# Patient Record
Sex: Male | Born: 1980 | Race: Black or African American | Hispanic: No | Marital: Single | Smoking: Current some day smoker
Health system: Southern US, Community
[De-identification: ages and names within clinical notes are randomized; demographics above are authoritative.]

## PROBLEM LIST (undated history)

## (undated) DIAGNOSIS — E785 Hyperlipidemia, unspecified: Secondary | ICD-10-CM

## (undated) HISTORY — PX: KNEE SURGERY: SHX244

---

## 2002-03-19 ENCOUNTER — Inpatient Hospital Stay (HOSPITAL_COMMUNITY): Admission: AC | Admit: 2002-03-19 | Discharge: 2002-03-22 | Payer: Self-pay

## 2002-03-19 ENCOUNTER — Encounter: Payer: Self-pay | Admitting: Emergency Medicine

## 2002-04-13 ENCOUNTER — Encounter: Admission: RE | Admit: 2002-04-13 | Discharge: 2002-05-18 | Payer: Self-pay | Admitting: Orthopedic Surgery

## 2005-04-17 ENCOUNTER — Emergency Department (HOSPITAL_COMMUNITY): Admission: EM | Admit: 2005-04-17 | Discharge: 2005-04-17 | Payer: Self-pay | Admitting: Emergency Medicine

## 2008-02-16 ENCOUNTER — Emergency Department (HOSPITAL_COMMUNITY): Admission: EM | Admit: 2008-02-16 | Discharge: 2008-02-16 | Payer: Self-pay | Admitting: Emergency Medicine

## 2008-05-14 ENCOUNTER — Emergency Department (HOSPITAL_COMMUNITY): Admission: EM | Admit: 2008-05-14 | Discharge: 2008-05-14 | Payer: Self-pay | Admitting: Emergency Medicine

## 2008-11-20 ENCOUNTER — Emergency Department (HOSPITAL_COMMUNITY): Admission: EM | Admit: 2008-11-20 | Discharge: 2008-11-20 | Payer: Self-pay | Admitting: Emergency Medicine

## 2010-05-22 ENCOUNTER — Emergency Department (HOSPITAL_COMMUNITY): Admission: EM | Admit: 2010-05-22 | Discharge: 2010-05-22 | Payer: Self-pay | Admitting: Family Medicine

## 2010-10-28 ENCOUNTER — Emergency Department (HOSPITAL_COMMUNITY)
Admission: EM | Admit: 2010-10-28 | Discharge: 2010-10-28 | Disposition: A | Discharge status: Home / Self Care | Payer: Self-pay | Attending: Emergency Medicine | Admitting: Emergency Medicine

## 2010-10-28 DIAGNOSIS — R059 Cough, unspecified: Secondary | ICD-10-CM | POA: Insufficient documentation

## 2010-10-28 DIAGNOSIS — R112 Nausea with vomiting, unspecified: Secondary | ICD-10-CM | POA: Insufficient documentation

## 2010-10-28 DIAGNOSIS — R05 Cough: Secondary | ICD-10-CM | POA: Insufficient documentation
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
## 2011-08-08 ENCOUNTER — Emergency Department (HOSPITAL_COMMUNITY): Payer: Self-pay

## 2011-08-08 ENCOUNTER — Emergency Department (HOSPITAL_COMMUNITY)
Admission: EM | Admit: 2011-08-08 | Discharge: 2011-08-09 | Disposition: A | Discharge status: Home / Self Care | Payer: Self-pay | Attending: Emergency Medicine | Admitting: Emergency Medicine

## 2011-08-08 ENCOUNTER — Encounter: Payer: Self-pay | Admitting: *Deleted

## 2011-08-08 DIAGNOSIS — S1093XA Contusion of unspecified part of neck, initial encounter: Secondary | ICD-10-CM | POA: Insufficient documentation

## 2011-08-08 DIAGNOSIS — R413 Other amnesia: Secondary | ICD-10-CM | POA: Insufficient documentation

## 2011-08-08 DIAGNOSIS — IMO0002 Reserved for concepts with insufficient information to code with codable children: Secondary | ICD-10-CM | POA: Insufficient documentation

## 2011-08-08 DIAGNOSIS — S0083XA Contusion of other part of head, initial encounter: Secondary | ICD-10-CM

## 2011-08-08 DIAGNOSIS — R220 Localized swelling, mass and lump, head: Secondary | ICD-10-CM | POA: Insufficient documentation

## 2011-08-08 DIAGNOSIS — F29 Unspecified psychosis not due to a substance or known physiological condition: Secondary | ICD-10-CM | POA: Insufficient documentation

## 2011-08-08 DIAGNOSIS — R221 Localized swelling, mass and lump, neck: Secondary | ICD-10-CM | POA: Insufficient documentation

## 2011-08-08 DIAGNOSIS — S0003XA Contusion of scalp, initial encounter: Secondary | ICD-10-CM | POA: Insufficient documentation

## 2011-08-08 DIAGNOSIS — S022XXA Fracture of nasal bones, initial encounter for closed fracture: Secondary | ICD-10-CM | POA: Insufficient documentation

## 2011-08-08 MED ORDER — TETANUS-DIPHTH-ACELL PERTUSSIS 5-2.5-18.5 LF-MCG/0.5 IM SUSP
0.5000 mL | Freq: Once | INTRAMUSCULAR | Status: AC
  Administered 2011-08-08: 0.5 mL via INTRAMUSCULAR
  Filled 2011-08-08: qty 0.5
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
## 2011-08-08 NOTE — ED Notes (Signed)
Resident at bedside to assess pt.  Removed from backboard, c-collar remains.
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
## 2011-08-09 MED ORDER — HYDROCODONE-ACETAMINOPHEN 5-500 MG PO TABS: 1.0000 | ORAL_TABLET | Freq: Four times a day (QID) | ORAL | Status: DC | PRN

## 2011-08-09 MED ORDER — IBUPROFEN 600 MG PO TABS: 600.0000 mg | ORAL_TABLET | Freq: Four times a day (QID) | ORAL | Status: DC | PRN

## 2011-08-09 NOTE — ED Notes (Signed)
Rx x 2, pt voiced understanding to f/u with PCP and surgeon if needed.
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
## 2011-08-13 ENCOUNTER — Emergency Department (HOSPITAL_COMMUNITY)
Admission: EM | Admit: 2011-08-13 | Discharge: 2011-08-13 | Discharge status: Against Medical Advice | Payer: Self-pay | Source: Home / Self Care | Attending: Family Medicine | Admitting: Family Medicine

## 2011-08-13 NOTE — ED Notes (Signed)
No answer in lobby @18 :56 & 20:51

## 2011-08-15 ENCOUNTER — Ambulatory Visit (HOSPITAL_COMMUNITY)
Admission: RE | Admit: 2011-08-15 | Discharge: 2011-08-15 | Disposition: A | Discharge status: Home / Self Care | Payer: Self-pay | Source: Ambulatory Visit | Attending: Otolaryngology | Admitting: Otolaryngology

## 2011-08-15 ENCOUNTER — Other Ambulatory Visit (HOSPITAL_COMMUNITY): Payer: Self-pay | Admitting: Otolaryngology

## 2011-08-15 DIAGNOSIS — S0993XA Unspecified injury of face, initial encounter: Secondary | ICD-10-CM | POA: Insufficient documentation

## 2011-08-15 DIAGNOSIS — X58XXXA Exposure to other specified factors, initial encounter: Secondary | ICD-10-CM | POA: Insufficient documentation

## 2011-08-15 DIAGNOSIS — R52 Pain, unspecified: Secondary | ICD-10-CM

## 2011-08-15 DIAGNOSIS — R51 Headache: Secondary | ICD-10-CM | POA: Insufficient documentation

## 2011-08-15 NOTE — ED Provider Notes (Signed)
History     CSN: [id number] Arrival date & time: 08/13/2011  6:52 PM   First MD Initiated Contact with Patient 08/13/11 1619      No chief complaint on file.   (Consider location/radiation/quality/duration/timing/severity/associated sxs/prior treatment) HPI  No past medical history on file.  No past surgical history on file.  No family history on file.  History  Substance Use Topics  . Smoking status: Not on file  . Smokeless tobacco: Not on file  . Alcohol Use: Yes      Review of Systems  Allergies  Review of patient's allergies indicates no known allergies.  Home Medications   Current Outpatient Rx  Name Route Sig Dispense Refill  . HYDROCODONE-ACETAMINOPHEN 5-500 MG PO TABS Oral Take 1 tablet by mouth every 6 (six) hours as needed for pain. 15 tablet 0  . IBUPROFEN 600 MG PO TABS Oral Take 1 tablet (600 mg total) by mouth every 6 (six) hours as needed for pain. 30 tablet 0    There were no vitals taken for this visit.  Physical Exam  ED Course  Procedures (including critical care time)  Labs Reviewed - No data to display    No diagnosis found.    MDM  Pt. Left before triage.        Sharin Grave, MD 08/15/11 1514

## 2011-08-17 ENCOUNTER — Emergency Department (HOSPITAL_COMMUNITY)
Admission: EM | Admit: 2011-08-17 | Discharge: 2011-08-17 | Disposition: A | Discharge status: Home / Self Care | Payer: Self-pay | Attending: Emergency Medicine | Admitting: Emergency Medicine

## 2011-08-17 ENCOUNTER — Encounter (HOSPITAL_COMMUNITY): Payer: Self-pay | Admitting: Emergency Medicine

## 2011-08-17 DIAGNOSIS — S0003XA Contusion of scalp, initial encounter: Secondary | ICD-10-CM | POA: Insufficient documentation

## 2011-08-17 DIAGNOSIS — R6884 Jaw pain: Secondary | ICD-10-CM | POA: Insufficient documentation

## 2011-08-17 DIAGNOSIS — S0083XA Contusion of other part of head, initial encounter: Secondary | ICD-10-CM

## 2011-08-17 DIAGNOSIS — R220 Localized swelling, mass and lump, head: Secondary | ICD-10-CM | POA: Insufficient documentation

## 2011-08-17 MED ORDER — HYDROCODONE-ACETAMINOPHEN 5-325 MG PO TABS: 1.0000 | ORAL_TABLET | Freq: Once | ORAL | Status: DC

## 2011-08-17 MED ORDER — HYDROCODONE-ACETAMINOPHEN 5-325 MG PO TABS
1.0000 | ORAL_TABLET | Freq: Once | ORAL | Status: AC
  Administered 2011-08-17: 1 via ORAL
  Filled 2011-08-17: qty 1
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
## 2011-08-17 NOTE — ED Notes (Signed)
Gave pt ice pack.
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
## 2011-08-17 NOTE — ED Notes (Signed)
Patient left without being seen.

## 2011-08-18 NOTE — ED Provider Notes (Signed)
Medical screening examination/treatment/procedure(s) were performed by non-physician practitioner and as supervising physician I was immediately available for consultation/collaboration.   Lyanne Co, MD 08/18/11 [phone]

## 2011-08-20 ENCOUNTER — Inpatient Hospital Stay (HOSPITAL_COMMUNITY)
Admission: AD | Admit: 2011-08-20 | Discharge: 2011-08-23 | DRG: 138 | Disposition: A | Discharge status: Home / Self Care | Payer: Self-pay | Source: Ambulatory Visit | Attending: Otolaryngology | Admitting: Otolaryngology

## 2011-08-20 ENCOUNTER — Encounter (HOSPITAL_COMMUNITY): Payer: Self-pay | Admitting: General Practice

## 2011-08-20 ENCOUNTER — Inpatient Hospital Stay (HOSPITAL_COMMUNITY): Payer: Self-pay

## 2011-08-20 DIAGNOSIS — Z23 Encounter for immunization: Secondary | ICD-10-CM

## 2011-08-20 DIAGNOSIS — F17200 Nicotine dependence, unspecified, uncomplicated: Secondary | ICD-10-CM | POA: Diagnosis present

## 2011-08-20 DIAGNOSIS — K122 Cellulitis and abscess of mouth: Principal | ICD-10-CM | POA: Diagnosis present

## 2011-08-20 LAB — DIFFERENTIAL
Basophils Absolute: 0 10*3/uL (ref 0.0–0.1)
Basophils Relative: 0 % (ref 0–1)
Neutro Abs: 8.3 10*3/uL — ABNORMAL HIGH (ref 1.7–7.7)
Neutrophils Relative %: 72 % (ref 43–77)

## 2011-08-20 LAB — BASIC METABOLIC PANEL
BUN: 8 mg/dL (ref 6–23)
Creatinine, Ser: 1.17 mg/dL (ref 0.50–1.35)
GFR calc Af Amer: >90 mL/min (ref >90)
GFR calc non Af Amer: 81 mL/min — ABNORMAL LOW (ref >90)
Potassium: 4.2 mEq/L (ref 3.5–5.1)

## 2011-08-20 LAB — CBC
MCHC: 34.5 g/dL (ref 30.0–36.0)
RDW: 13.5 % (ref 11.5–15.5)

## 2011-08-20 MED ORDER — CHLORHEXIDINE GLUCONATE 0.12 % MT SOLN
15.0000 mL | Freq: Two times a day (BID) | OROMUCOSAL | Status: DC
  Administered 2011-08-20 – 2011-08-22 (×4): 15 mL via OROMUCOSAL
  Filled 2011-08-20 (×4): qty 15

## 2011-08-20 MED ORDER — BIOTENE DRY MOUTH MT LIQD
15.0000 mL | Freq: Two times a day (BID) | OROMUCOSAL | Status: DC
  Administered 2011-08-20 – 2011-08-22 (×4): 15 mL via OROMUCOSAL

## 2011-08-20 MED ORDER — IOHEXOL 300 MG/ML  SOLN: 75.0000 mL | Freq: Once | INTRAMUSCULAR | Status: AC | PRN

## 2011-08-20 MED ORDER — CLINDAMYCIN PHOSPHATE 600 MG/50ML IV SOLN
600.0000 mg | Freq: Four times a day (QID) | INTRAVENOUS | Status: DC
  Administered 2011-08-20 – 2011-08-23 (×12): 600 mg via INTRAVENOUS
  Filled 2011-08-20 (×15): qty 50

## 2011-08-20 MED ORDER — DEXTROSE-NACL 5-0.45 % IV SOLN
INTRAVENOUS | Status: DC
  Administered 2011-08-20 – 2011-08-21 (×3): via INTRAVENOUS

## 2011-08-20 MED ORDER — NICOTINE 7 MG/24HR TD PT24
7.0000 mg | MEDICATED_PATCH | Freq: Every day | TRANSDERMAL | Status: DC
  Administered 2011-08-20 – 2011-08-22 (×3): 7 mg via TRANSDERMAL
  Filled 2011-08-20 (×4): qty 1

## 2011-08-20 MED ORDER — ACETAMINOPHEN 325 MG PO TABS
650.0000 mg | ORAL_TABLET | Freq: Four times a day (QID) | ORAL | Status: DC | PRN
  Administered 2011-08-20 – 2011-08-21 (×3): 650 mg via ORAL
  Filled 2011-08-20 (×3): qty 2

## 2011-08-20 MED ORDER — IBUPROFEN 400 MG PO TABS
400.0000 mg | ORAL_TABLET | Freq: Four times a day (QID) | ORAL | Status: DC | PRN
  Administered 2011-08-20 – 2011-08-23 (×8): 400 mg via ORAL
  Filled 2011-08-20 (×7): qty 1

## 2011-08-20 MED ORDER — INFLUENZA VIRUS VACC SPLIT PF IM SUSP
0.5000 mL | INTRAMUSCULAR | Status: AC
  Administered 2011-08-21: 0.5 mL via INTRAMUSCULAR
  Filled 2011-08-20: qty 0.5
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
## 2011-08-20 NOTE — Progress Notes (Signed)
Patient ID: Nicholas Mercer, male   DOB: 1981/01/02, 30 y.o.   MRN: [id number] CT scan reviewed. There is a hypo-lucent area in the masticator space that does not appear to be directly related to a dental source. I will plan to perform incision and drainage tomorrow morning. We'll start him on a diet for tonight, and some analgesia as well as intravenous antibiotics.

## 2011-08-21 ENCOUNTER — Encounter (HOSPITAL_COMMUNITY): Payer: Self-pay | Admitting: Anesthesiology

## 2011-08-21 ENCOUNTER — Encounter (HOSPITAL_COMMUNITY)
Admission: AD | Disposition: A | Discharge status: Home / Self Care | Payer: Self-pay | Source: Ambulatory Visit | Attending: Otolaryngology

## 2011-08-21 ENCOUNTER — Inpatient Hospital Stay (HOSPITAL_COMMUNITY): Payer: Self-pay | Admitting: Anesthesiology

## 2011-08-21 HISTORY — PX: IRRIGATION AND DEBRIDEMENT ABSCESS: SHX5252

## 2011-08-21 SURGERY — IRRIGATION AND DEBRIDEMENT ABSCESS
Anesthesia: General | Site: Mouth | Laterality: Right | Wound class: Dirty or Infected

## 2011-08-21 MED ORDER — ONDANSETRON HCL 4 MG/2ML IJ SOLN
INTRAMUSCULAR | Status: DC | PRN
  Administered 2011-08-21: 4 mg via INTRAVENOUS

## 2011-08-21 MED ORDER — MIDAZOLAM HCL 5 MG/5ML IJ SOLN
INTRAMUSCULAR | Status: DC | PRN
  Administered 2011-08-21: 2 mg via INTRAVENOUS

## 2011-08-21 MED ORDER — SUCCINYLCHOLINE CHLORIDE 20 MG/ML IJ SOLN
INTRAMUSCULAR | Status: DC | PRN
  Administered 2011-08-21: 100 mg via INTRAVENOUS

## 2011-08-21 MED ORDER — HYDROCODONE-ACETAMINOPHEN 5-325 MG PO TABS
1.0000 | ORAL_TABLET | ORAL | Status: DC | PRN
  Administered 2011-08-21: 1 via ORAL

## 2011-08-21 MED ORDER — LACTATED RINGERS IV SOLN
INTRAVENOUS | Status: DC | PRN
  Administered 2011-08-21: 16:00:00 via INTRAVENOUS

## 2011-08-21 MED ORDER — OXYCODONE-ACETAMINOPHEN 5-325 MG PO TABS
1.0000 | ORAL_TABLET | Freq: Four times a day (QID) | ORAL | Status: DC | PRN
  Administered 2011-08-21 – 2011-08-22 (×3): 2 via ORAL
  Filled 2011-08-21 (×3): qty 2

## 2011-08-21 MED ORDER — PROMETHAZINE HCL 25 MG RE SUPP: 25.0000 mg | Freq: Four times a day (QID) | RECTAL | Status: DC | PRN

## 2011-08-21 MED ORDER — SODIUM CHLORIDE 0.9 % IR SOLN
Status: DC | PRN
  Administered 2011-08-21: 1000 mL

## 2011-08-21 MED ORDER — FENTANYL CITRATE 0.05 MG/ML IJ SOLN
INTRAMUSCULAR | Status: DC | PRN
  Administered 2011-08-21 (×3): 50 ug via INTRAVENOUS
  Administered 2011-08-21: 100 ug via INTRAVENOUS

## 2011-08-21 MED ORDER — LACTATED RINGERS IV SOLN
INTRAVENOUS | Status: DC
  Administered 2011-08-21: 15:00:00 via INTRAVENOUS

## 2011-08-21 MED ORDER — MORPHINE SULFATE 2 MG/ML IJ SOLN
1.0000 mg | INTRAMUSCULAR | Status: DC | PRN
  Administered 2011-08-21 – 2011-08-23 (×3): 2 mg via INTRAVENOUS
  Filled 2011-08-21 (×3): qty 1

## 2011-08-21 MED ORDER — PROPOFOL 10 MG/ML IV EMUL
INTRAVENOUS | Status: DC | PRN
  Administered 2011-08-21: 200 mg via INTRAVENOUS

## 2011-08-21 SURGICAL SUPPLY — 46 items
ATTRACTOMAT 16X20 MAGNETIC DRP (DRAPES) IMPLANT
BANDAGE CONFORM 2  STR LF (GAUZE/BANDAGES/DRESSINGS) IMPLANT
BANDAGE GAUZE ELAST BULKY 4 IN (GAUZE/BANDAGES/DRESSINGS) IMPLANT
BLADE SURG 15 STRL LF DISP TIS (BLADE) ×2 IMPLANT
BLADE SURG 15 STRL SS (BLADE) ×1
CANISTER SUCTION 2500CC (MISCELLANEOUS) IMPLANT
CATH ROBINSON RED A/P 16FR (CATHETERS) IMPLANT
CLEANER TIP ELECTROSURG 2X2 (MISCELLANEOUS) ×3 IMPLANT
CLOTH BEACON ORANGE TIMEOUT ST (SAFETY) ×3 IMPLANT
CONT SPEC 4OZ CLIKSEAL STRL BL (MISCELLANEOUS) ×3 IMPLANT
COVER SURGICAL LIGHT HANDLE (MISCELLANEOUS) ×3 IMPLANT
DRAIN PENROSE 1/4X12 LTX STRL (WOUND CARE) ×3 IMPLANT
DRSG EMULSION OIL 3X3 NADH (GAUZE/BANDAGES/DRESSINGS) ×3 IMPLANT
ELECT COATED BLADE 2.86 ST (ELECTRODE) ×3 IMPLANT
ELECT NEEDLE TIP 2.8 STRL (NEEDLE) IMPLANT
ELECT REM PT RETURN 9FT ADLT (ELECTROSURGICAL) ×3
ELECTRODE REM PT RTRN 9FT ADLT (ELECTROSURGICAL) ×2 IMPLANT
GAUZE PACKING IODOFORM 1/2 (PACKING) ×3 IMPLANT
GAUZE PACKING IODOFORM 1/4X5 (PACKING) ×3 IMPLANT
GAUZE SPONGE 4X4 16PLY XRAY LF (GAUZE/BANDAGES/DRESSINGS) ×3 IMPLANT
GLOVE ECLIPSE 7.5 STRL STRAW (GLOVE) ×3 IMPLANT
GOWN PREVENTION PLUS XLARGE (GOWN DISPOSABLE) ×3 IMPLANT
GOWN PREVENTION PLUS XXLARGE (GOWN DISPOSABLE) ×3 IMPLANT
GOWN STRL NON-REIN LRG LVL3 (GOWN DISPOSABLE) IMPLANT
KIT BASIN OR (CUSTOM PROCEDURE TRAY) ×3 IMPLANT
KIT ROOM TURNOVER OR (KITS) ×3 IMPLANT
NEEDLE HYPO 30X.5 LL (NEEDLE) ×3 IMPLANT
NS IRRIG 1000ML POUR BTL (IV SOLUTION) ×3 IMPLANT
PAD ARMBOARD 7.5X6 YLW CONV (MISCELLANEOUS) ×6 IMPLANT
PENCIL FOOT CONTROL (ELECTRODE) ×3 IMPLANT
POUCH STERILIZING 3 X22 (STERILIZATION PRODUCTS) IMPLANT
SPONGE GAUZE 4X4 12PLY (GAUZE/BANDAGES/DRESSINGS) IMPLANT
SUT CHROMIC 4 0 P 3 18 (SUTURE) ×3 IMPLANT
SUT ETHILON 4 0 PS 2 18 (SUTURE) ×3 IMPLANT
SUT ETHILON 5 0 P 3 18 (SUTURE) ×1
SUT NYLON ETHILON 5-0 P-3 1X18 (SUTURE) ×2 IMPLANT
SUT SILK 4 0 REEL (SUTURE) ×3 IMPLANT
SWAB COLLECTION DEVICE MRSA (MISCELLANEOUS) ×3 IMPLANT
SYR BULB IRRIGATION 50ML (SYRINGE) IMPLANT
SYR CONTROL 10ML LL (SYRINGE) ×3 IMPLANT
TOWEL OR 17X24 6PK STRL BLUE (TOWEL DISPOSABLE) ×3 IMPLANT
TOWEL OR 17X26 10 PK STRL BLUE (TOWEL DISPOSABLE) ×3 IMPLANT
TRAY ENT MC OR (CUSTOM PROCEDURE TRAY) ×3 IMPLANT
TUBE ANAEROBIC SPECIMEN COL (MISCELLANEOUS) ×3 IMPLANT
WATER STERILE IRR 1000ML POUR (IV SOLUTION) ×3 IMPLANT
YANKAUER SUCT BULB TIP NO VENT (SUCTIONS) IMPLANT

## 2011-08-21 NOTE — Transfer of Care (Signed)
Immediate Anesthesia Transfer of Care Note  Patient: Nicholas Mercer  Procedure(s) Performed:  IRRIGATION AND DEBRIDEMENT ABSCESS  Patient Location: PACU  Anesthesia Type: General  Level of Consciousness: awake, alert  and oriented  Airway & Oxygen Therapy: Patient Spontanous Breathing  Post-op Assessment: Report given to PACU RN and Post -op Vital signs reviewed and stable  Post vital signs: Reviewed and stable  Complications: No apparent anesthesia complications

## 2011-08-21 NOTE — Anesthesia Preprocedure Evaluation (Addendum)
Anesthesia Evaluation  Patient identified by MRN, date of birth, ID band Patient awake    Reviewed: Allergy & Precautions, H&P , NPO status , Patient's Chart, lab work & pertinent test results  History of Anesthesia Complications Negative for: history of anesthetic complications  Airway Mallampati: III TM Distance: >3 FB Neck ROM: full  Mouth opening: Limited Mouth Opening  Dental  (+) Teeth Intact and Dental Advidsory Given   Pulmonary Current Smoker,          Cardiovascular neg cardio ROS regular     Neuro/Psych Negative Neurological ROS  Negative Psych ROS   GI/Hepatic negative GI ROS, Neg liver ROS,   Endo/Other  Negative Endocrine ROS  Renal/GU negative Renal ROS  Genitourinary negative   Musculoskeletal   Abdominal Normal abdominal exam  (+)   Peds  Hematology negative hematology ROS (+)   Anesthesia Other Findings   Reproductive/Obstetrics negative OB ROS                           Anesthesia Physical Anesthesia Plan  ASA: II  Anesthesia Plan: General   Post-op Pain Management:    Induction: Intravenous and Rapid sequence  Airway Management Planned: Oral ETT  Additional Equipment:   Intra-op Plan:   Post-operative Plan: Extubation in OR  Informed Consent: I have reviewed the patients History and Physical, chart, labs and discussed the procedure including the risks, benefits and alternatives for the proposed anesthesia with the patient or authorized representative who has indicated his/her understanding and acceptance.   Dental Advisory Given  Plan Discussed with: CRNA and Surgeon  Anesthesia Plan Comments:        Anesthesia Quick Evaluation

## 2011-08-21 NOTE — Progress Notes (Signed)
Received patient from OR s/p I&D of facial abscess. Patient is alert and  Oriented, not in any distress,VSS. Will continue to monitor.

## 2011-08-21 NOTE — Preoperative (Signed)
Beta Blockers   Reason not to administer Beta Blockers:Not Applicable 

## 2011-08-21 NOTE — Anesthesia Postprocedure Evaluation (Signed)
  Anesthesia Post-op Note  Patient: Nicholas Mercer  Procedure(s) Performed:  IRRIGATION AND DEBRIDEMENT ABSCESS  Patient Location: PACU  Anesthesia Type: General  Level of Consciousness: alert   Airway and Oxygen Therapy: Patient connected to nasal cannula oxygen  Post-op Pain: mild  Post-op Assessment: Post-op Vital signs reviewed  Post-op Vital Signs: stable  Complications: No apparent anesthesia complications

## 2011-08-21 NOTE — Op Note (Signed)
OPERATIVE REPORT  DATE OF SURGERY: 08/21/2011  PATIENT:  Nicholas Mercer,  30 y.o. male  PRE-OPERATIVE DIAGNOSIS:  CHEEK ABSESS RIGHT SIDE  POST-OPERATIVE DIAGNOSIS:  CHEEK ABSESS RIGHT SIDE  PROCEDURE:  Procedure(s): IRRIGATION AND DEBRIDEMENT ABSCESS  SURGEON:  Susy Frizzle, MD  ASSISTANTS: none   ANESTHESIA:   general  EBL:  30 ml  DRAINS: none   LOCAL MEDICATIONS USED:  NONE  SPECIMEN:  No Specimen  COUNTS:  YES  PROCEDURE DETAILS: The patient was taken to the operating room and placed on the operating table in the supine position. Following induction of general endotracheal anesthesia, the face was draped in a standard fashion. The oral cavity was inspected. The right septal mucosa was a significant amount of swelling especially inferiorly. Palpation of the parotid gland produced clear healthy appearing salivary secretions. Palpation of the abscess revealed bloody secretions without any frank pus as was present yesterday. Electrocautery was used to incise the mucosa overlying the abscess cavity. Blunt dissection using a large hemostat was then used to open into the loculations of the abscess cavity. Culture risks were used to take samples which were sent for culture and sensitivity testing. Finger dissection was used to open up all loculations completely. The wound was then carefully irrigated with saline solution. The wound was then packed with half inch iodoform gauze. This was secured in place using a single 2-0 silk suture. The pharynx was suctioned of blood and secretions. Patient was awakened extubated and transferred to recovery in stable condition.   PATIENT DISPOSITION:  PACU - hemodynamically stable.

## 2011-08-21 NOTE — Progress Notes (Signed)
08/21/2011 6:52 PM  Georga Kaufmann [id number]  Post-Op Day 0    Temp:  [97.7 F (36.5 C)-99.4 F (37.4 C)] 98.7 F (37.1 C) (12/12 1806) Pulse Rate:  [69-103] 69  (12/12 1806) Resp:  [18-20] 20  (12/12 1806) BP: (108-142)/(68-93) 134/93 mmHg (12/12 1806) SpO2:  [98 %-100 %] 100 % (12/12 1806),     Intake/Output Summary (Last 24 hours) at 08/21/11 1852 Last data filed at 08/21/11 1702  Gross per 24 hour  Intake   1369 ml  Output   1765 ml  Net   -396 ml   Small po   No results found for this or any previous visit (from the past 24 hour(s)).  SUBJECTIVE:  Breathing OK.. Pain not well controlled.  No bleeding.  No void yet.  OBJECTIVE:  Mod facial swelling.  Nu gauze packing intact.    IMPRESSION:  Satisfactory check  PLAN:  Po Percocet.  IV morphine for breakthrough.  Stand to void.  Advance diet, activity.  Cephus Richer

## 2011-08-22 ENCOUNTER — Encounter (HOSPITAL_COMMUNITY): Payer: Self-pay | Admitting: Otolaryngology

## 2011-08-22 MED ORDER — HYDROCODONE-ACETAMINOPHEN 5-325 MG PO TABS
1.0000 | ORAL_TABLET | Freq: Four times a day (QID) | ORAL | Status: DC | PRN
  Administered 2011-08-22 – 2011-08-23 (×4): 2 via ORAL
  Filled 2011-08-22 (×5): qty 2
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
## 2011-08-23 MED ORDER — PROMETHAZINE HCL 25 MG RE SUPP: 25.0000 mg | Freq: Four times a day (QID) | RECTAL | Status: AC | PRN

## 2011-08-23 MED ORDER — CLINDAMYCIN HCL 300 MG PO CAPS: 300.0000 mg | ORAL_CAPSULE | Freq: Four times a day (QID) | ORAL | Status: AC

## 2011-08-23 MED ORDER — CHLORHEXIDINE GLUCONATE 0.12 % MT SOLN: 15.0000 mL | Freq: Two times a day (BID) | OROMUCOSAL | Status: AC

## 2011-08-23 MED ORDER — HYDROCODONE-ACETAMINOPHEN 5-325 MG PO TABS: 1.0000 | ORAL_TABLET | Freq: Four times a day (QID) | ORAL | Status: AC | PRN

## 2011-08-23 NOTE — Progress Notes (Signed)
Patient ID: Nicholas Mercer, male   DOB: Mar 03, 1981, 30 y.o.   MRN: [id number] Subjective: Doing well.  Objective: Vital signs in last 24 hours: Temp:  [97.9 F (36.6 C)-98.3 F (36.8 C)] 97.9 F (36.6 C) (12/14 0600) Pulse Rate:  [81-88] 86  (12/14 0600) Resp:  [18] 18  (12/14 0600) BP: (108-128)/(66-73) 108/66 mmHg (12/14 0600) SpO2:  [98 %-99 %] 98 % (12/14 0600) Weight change:  Last BM Date: 08/16/11  Intake/Output from previous day: 12/13 0701 - 12/14 0700 In: 1312 [P.O.:840; I.V.:472] Out: 1050 [Urine:1050] Intake/Output this shift: Total I/O In: 240 [P.O.:240] Out: -   PHYSICAL EXAM: Swelling persists. Packing removed. Wound granulating.  Lab Results:  [facility] 08/20/11 1156  WBC 11.5*  HGB 14.5  HCT 42.0  PLT 338   BMET  Basename 08/20/11 1156  NA 135  K 4.2  CL 97  CO2 29  GLUCOSE 91  BUN 8  CREATININE 1.17  CALCIUM 9.8    Studies/Results: No results found.  Medications: I have reviewed the patient's current medications.  Assessment/Plan: Progressing well, cultures negative. Discharge home, on Clindamycin.  LOS: 3 days   Lashaun Poch H 08/23/2011, 11:35 AM

## 2011-08-23 NOTE — Progress Notes (Signed)
Pt given dc instructions. Rn went over medications and incision/wound care. Pt verbalized understanding and had no questions regarding care of instructions.  

## 2011-08-23 NOTE — Discharge Summary (Signed)
  Physician Discharge Summary  Patient ID: Nicholas Mercer MRN: [id number] DOB/AGE: [birth year] 30 y.o.  Admit date: 08/20/2011 Discharge date: 08/23/2011  Admission Diagnoses: Masticator space abscess  Discharge Diagnoses:  Active Problems:  * No active hospital problems. *    Discharged Condition: good  Hospital Course: I&D performed, cultures negative. Afebrile. Doing better.  Consults: none  Significant Diagnostic Studies: none  Treatments: surgery: I&D  Discharge Exam: Blood pressure 108/66, pulse 86, temperature 97.9 F (36.6 C), temperature source Oral, resp. rate 18, height 5\' 7"  (1.702 m), weight 86.183 kg (190 lb), SpO2 98.00%. PHYSICAL EXAM: Swelling down, packing removed.   Disposition: Home or Self Care   Current Discharge Medication List    CONTINUE these medications which have NOT CHANGED   Details  diphenhydramine-acetaminophen (TYLENOL PM) 25-500 MG TABS Take 1 tablet by mouth at bedtime as needed. For pain     HYDROcodone-acetaminophen (NORCO) 5-325 MG per tablet Take 1 tablet by mouth every 6 (six) hours as needed. For pain     ibuprofen (ADVIL,MOTRIN) 200 MG tablet Take 600 mg by mouth every 6 (six) hours as needed. For pain       STOP taking these medications     HYDROcodone-acetaminophen (VICODIN) 5-500 MG per tablet          Signed: Alyssa Mancera H 08/23/2011, 11:36 AM

## 2011-08-24 LAB — CULTURE, ROUTINE-ABSCESS

## 2011-08-26 LAB — ANAEROBIC CULTURE

## 2012-09-21 ENCOUNTER — Encounter (HOSPITAL_COMMUNITY): Payer: Self-pay | Admitting: Emergency Medicine

## 2012-09-21 ENCOUNTER — Emergency Department (HOSPITAL_COMMUNITY)
Admission: EM | Admit: 2012-09-21 | Discharge: 2012-09-21 | Disposition: A | Discharge status: Home / Self Care | Payer: Self-pay | Attending: Emergency Medicine | Admitting: Emergency Medicine

## 2012-09-21 DIAGNOSIS — A64 Unspecified sexually transmitted disease: Secondary | ICD-10-CM | POA: Insufficient documentation

## 2012-09-21 DIAGNOSIS — Z202 Contact with and (suspected) exposure to infections with a predominantly sexual mode of transmission: Secondary | ICD-10-CM

## 2012-09-21 DIAGNOSIS — F17200 Nicotine dependence, unspecified, uncomplicated: Secondary | ICD-10-CM | POA: Insufficient documentation

## 2012-09-21 MED ORDER — CEFTRIAXONE SODIUM 250 MG IJ SOLR
250.0000 mg | Freq: Once | INTRAMUSCULAR | Status: AC
  Administered 2012-09-21: 250 mg via INTRAMUSCULAR
  Filled 2012-09-21: qty 250

## 2012-09-21 MED ORDER — AZITHROMYCIN 250 MG PO TABS
1000.0000 mg | ORAL_TABLET | Freq: Once | ORAL | Status: AC
  Administered 2012-09-21: 1000 mg via ORAL
  Filled 2012-09-21: qty 4
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
## 2012-09-21 NOTE — ED Notes (Signed)
Pt states he was informed by a friend that he had been exposed to GC/Chlamydia. Here for testing and tx.

## 2012-09-22 NOTE — ED Provider Notes (Signed)
Medical screening examination/treatment/procedure(s) were performed by non-physician practitioner and as supervising physician I was immediately available for consultation/collaboration.   Charles B. Bernette Mayers, MD 09/22/12 [phone]

## 2016-02-08 ENCOUNTER — Encounter (HOSPITAL_BASED_OUTPATIENT_CLINIC_OR_DEPARTMENT_OTHER): Payer: Self-pay | Admitting: Emergency Medicine

## 2016-02-08 ENCOUNTER — Emergency Department (HOSPITAL_BASED_OUTPATIENT_CLINIC_OR_DEPARTMENT_OTHER)
Admission: EM | Admit: 2016-02-08 | Discharge: 2016-02-08 | Disposition: A | Discharge status: Home / Self Care | Payer: Self-pay | Attending: Emergency Medicine | Admitting: Emergency Medicine

## 2016-02-08 DIAGNOSIS — F17200 Nicotine dependence, unspecified, uncomplicated: Secondary | ICD-10-CM | POA: Insufficient documentation

## 2016-02-08 DIAGNOSIS — L03011 Cellulitis of right finger: Secondary | ICD-10-CM | POA: Insufficient documentation

## 2016-02-08 MED ORDER — CEPHALEXIN 500 MG PO CAPS: 500.0000 mg | ORAL_CAPSULE | Freq: Four times a day (QID) | ORAL | Status: DC

## 2016-02-08 MED ORDER — LIDOCAINE HCL 2 % IJ SOLN
INTRAMUSCULAR | Status: AC
  Filled 2016-02-08: qty 20

## 2016-02-08 MED ORDER — LIDOCAINE HCL (PF) 2 % IJ SOLN
10.0000 mL | Freq: Once | INTRAMUSCULAR | Status: AC
  Administered 2016-02-08: 10 mL via INTRADERMAL

## 2016-02-08 NOTE — ED Provider Notes (Signed)
CSN: [id number]     Arrival date & time 02/08/16  2102 History  By signing my name below, I, [name], attest that this documentation has been prepared under the direction and in the presence of Geoffery Lyonsouglas Aizlynn Digilio, MD . Electronically Signed: Majel HomerPeyton Lee, Scribe. 02/08/2016. 10:45 PM.   Chief Complaint  Patient presents with  . Finger Injury   The history is provided by the patient. No language interpreter was used.    HPI Comments:  Nicholas Mercer is a 35 y.o. male who presents to the Emergency Department complaining of sudden onset, gradually worsening, right middle finger swelling and pain that began 2 days ago. Pt reports that he was bowling the day before he experienced his symptoms. Pt states that his finger began to drain earlier today. He has not taken any medication to alleviate his pain. Pt denies hx of DM and any other complaints.   History reviewed. No pertinent past medical history. Past Surgical History  Procedure Laterality Date  . Knee surgery    . Irrigation and debridement abscess  08/21/2011    Procedure: IRRIGATION AND DEBRIDEMENT ABSCESS;  Surgeon: Susy FrizzleJefry H Rosen, MD;  [facility];  Service: ENT;  Laterality: Right;   History reviewed. No pertinent family history. Social History  Substance Use Topics  . Smoking status: Current Some Day Smoker  . Smokeless tobacco: Never Used  . Alcohol Use: Yes    Review of Systems 10 systems reviewed and all are negative for acute change except as noted in the HPI.  Allergies  Review of patient's allergies indicates no known allergies.  Home Medications   Prior to Admission medications   Not on File   Triage Vitals: BP 121/85 mmHg  Pulse 96  Temp(Src) 98.4 F (36.9 C) (Oral)  Resp 18  Ht 5\' 8"  (1.727 m)  Wt 220 lb (99.791 kg)  BMI 33.46 kg/m2  SpO2 98% Physical Exam  Constitutional: He is oriented to person, place, and time. He appears well-developed and well-nourished.  HENT:  Head: Normocephalic.  Eyes: EOM  are normal.  Neck: Normal range of motion.  Pulmonary/Chest: Effort normal.  Abdominal: He exhibits no distension.  Musculoskeletal: Normal range of motion.  The right middle finger has swelling, tenderness, and purulent drainage from the tissues around the cuticle.  Neurological: He is alert and oriented to person, place, and time.  Psychiatric: He has a normal mood and affect.  Nursing note and vitals reviewed.   ED Course  Procedures  DIAGNOSTIC STUDIES:  Oxygen Saturation is 98% on RA, normal by my interpretation.    COORDINATION OF CARE:  10:31 PM Discussed treatment plan, which includes I&D with pt at bedside and pt agreed to plan.  Labs Review Labs Reviewed - No data to display  Imaging Review No results found. I have personally reviewed and evaluated these images and lab results as part of my medical decision-making.     MDM   Final diagnoses:  None    Digital block performed with 2% lidocaine and incision of the paronychia was made with a #11 blade. A significant amount of pus was expressed. The wound will be dressed. He will be treated with antibiotics, warm soaks, when necessary return.  I personally performed the services described in this documentation, which was scribed in my presence. The recorded information has been reviewed and is accurate.       Geoffery Lyonsouglas Sten Dematteo, MD 02/08/16 2300

## 2016-02-08 NOTE — Discharge Instructions (Signed)
Keflex as prescribed.  Warm soaks to the finger frequently as possible for the next several days.  Return to the emergency department for increased pain, swelling, redness, or other new and concerning symptoms.   Paronychia Paronychia is an infection of the skin that surrounds a nail. It usually affects the skin around a fingernail, but it may also occur near a toenail. It often causes pain and swelling around the nail. This condition may come on suddenly or develop over a longer period. In some cases, a collection of pus (abscess) can form near or under the nail. Usually, paronychia is not serious and it clears up with treatment. CAUSES This condition may be caused by bacteria or fungi. It is commonly caused by either Streptococcus or Staphylococcus bacteria. The bacteria or fungi often cause the infection by getting into the affected area through an opening in the skin, such as a cut or a hangnail. RISK FACTORS This condition is more likely to develop in:  People who get their hands wet often, such as those who work as Fish farm managerdishwashers, bartenders, or nurses.  People who bite their fingernails or suck their thumbs.  People who trim their nails too short.  People who have hangnails or injured fingertips.  People who get manicures.  People who have diabetes. SYMPTOMS Symptoms of this condition include:  Redness and swelling of the skin near the nail.  Tenderness around the nail when you touch the area.  Pus-filled bumps under the cuticle. The cuticle is the skin at the base or sides of the nail.  Fluid or pus under the nail.  Throbbing pain in the area. DIAGNOSIS This condition is usually diagnosed with a physical exam. In some cases, a sample of pus may be taken from an abscess to be tested in a lab. This can help to determine what type of bacteria or fungi is causing the condition. TREATMENT Treatment for this condition depends on the cause and severity of the condition. If the  condition is mild, it may clear up on its own in a few days. Your health care provider may recommend soaking the affected area in warm water a few times a day. When treatment is needed, the options may include:  Antibiotic medicine, if the condition is caused by a bacterial infection.  Antifungal medicine, if the condition is caused by a fungal infection.  Incision and drainage, if an abscess is present. In this procedure, the health care provider will cut open the abscess so the pus can drain out. HOME CARE INSTRUCTIONS  Soak the affected area in warm water if directed to do so by your health care provider. You may be told to do this for 20 minutes, 2-3 times a day. Keep the area dry in between soakings.  Take medicines only as directed by your health care provider.  If you were prescribed an antibiotic medicine, finish all of it even if you start to feel better.  Keep the affected area clean.  Do not try to drain a fluid-filled bump yourself.  If you will be washing dishes or performing other tasks that require your hands to get wet, wear rubber gloves. You should also wear gloves if your hands might come in contact with irritating substances, such as cleaners or chemicals.  Follow your health care provider's instructions about:  Wound care.  Bandage (dressing) changes and removal. SEEK MEDICAL CARE IF:  Your symptoms get worse or do not improve with treatment.  You have a fever or chills.  You have redness spreading from the affected area.  You have continued or increased fluid, blood, or pus coming from the affected area.  Your finger or knuckle becomes swollen or is difficult to move.   This information is not intended to replace advice given to you by your health care provider. Make sure you discuss any questions you have with your health care provider.   Document Released: 02/19/2001 Document Revised: 01/10/2015 Document Reviewed: 08/03/2014 Elsevier Interactive  Patient Education [employer]

## 2016-02-08 NOTE — ED Notes (Addendum)
Patient reports that he is having pain and swelling to his right middle finger. The patient reports that he has had this x 2 days, the patient has noted swelling to his nail bed with a white spot, the patient also has swelling all the way to his knuckle

## 2016-07-26 ENCOUNTER — Encounter (HOSPITAL_COMMUNITY): Payer: Self-pay | Admitting: Emergency Medicine

## 2016-07-26 ENCOUNTER — Emergency Department (HOSPITAL_COMMUNITY)
Admission: EM | Admit: 2016-07-26 | Discharge: 2016-07-27 | Disposition: A | Discharge status: Home / Self Care | Payer: Self-pay | Attending: Emergency Medicine | Admitting: Emergency Medicine

## 2016-07-26 DIAGNOSIS — R8290 Unspecified abnormal findings in urine: Secondary | ICD-10-CM

## 2016-07-26 DIAGNOSIS — F17200 Nicotine dependence, unspecified, uncomplicated: Secondary | ICD-10-CM | POA: Insufficient documentation

## 2016-07-26 DIAGNOSIS — R8299 Other abnormal findings in urine: Secondary | ICD-10-CM | POA: Insufficient documentation

## 2016-07-26 LAB — URINALYSIS, ROUTINE W REFLEX MICROSCOPIC
BILIRUBIN URINE: NEGATIVE
GLUCOSE, UA: NEGATIVE mg/dL
HGB URINE DIPSTICK: NEGATIVE
KETONES UR: NEGATIVE mg/dL
Leukocytes, UA: NEGATIVE
NITRITE: NEGATIVE
PH: 5.5 (ref 5.0–8.0)
Protein, ur: NEGATIVE mg/dL
Specific Gravity, Urine: 1.029 (ref 1.005–1.030)

## 2016-07-26 NOTE — ED Provider Notes (Signed)
[facility] Provider Note   CSN: [id number] Arrival date & time: 07/26/16  2237  By signing my name below, I, Rosario AdieWilliam Andrew Hiatt, attest that this documentation has been prepared under the direction and in the presence of [facility] Hospital[facility] Neese, [place].  Electronically Signed: Rosario AdieWilliam Andrew Hiatt, ED Scribe. 07/26/16. 11:39 PM  History   Chief Complaint Chief Complaint  Patient presents with  . "bubbles in urine"   The history is provided by the patient. No language interpreter was used.   HPI Comments: Nicholas Mercer is a 35 y.o. male who presents to the Emergency Department complaining of "bubbles in my urine" which has been ongoing over the past month. He describes this as his urine being more foam-like over the past month. There are no exacerbating or alleviating factors. No noted treatments were tried for his symptoms. Pt has been between sexual partners which he admits to having unprotected sex with since the onset of his symptoms. His partners are currently asymptomatic. He denies dysuria, fever, abdominal pain, penile discharge, urgency, frequency, hematuria, difficulty urinating or any other associated symptoms.   History reviewed. No pertinent past medical history.  There are no active problems to display for this patient.  Past Surgical History:  Procedure Laterality Date  . IRRIGATION AND DEBRIDEMENT ABSCESS  08/21/2011   Procedure: IRRIGATION AND DEBRIDEMENT ABSCESS;  Surgeon: Susy FrizzleJefry H Rosen, MD;  [facility];  Service: ENT;  Laterality: Right;  . KNEE SURGERY      Home Medications    Prior to Admission medications   Medication Sig Start Date End Date Taking? Authorizing Provider  cephALEXin (KEFLEX) 500 MG capsule Take 1 capsule (500 mg total) by mouth 4 (four) times daily. 02/08/16   Geoffery Lyonsouglas Delo, MD   Family History No family history on file.  Social History Social History  Substance Use Topics  . Smoking status: Current Some Day Smoker  . Smokeless  tobacco: Never Used  . Alcohol use Yes   Allergies   Patient has no known allergies.  Review of Systems Review of Systems  Constitutional: Negative for fever.  Gastrointestinal: Negative for abdominal pain.  Genitourinary: Negative for difficulty urinating, discharge, dysuria, hematuria, penile pain, penile swelling, scrotal swelling, testicular pain and urgency.  All other systems reviewed and are negative.  Physical Exam Updated Vital Signs BP 117/72 (BP Location: Left Arm)   Pulse 74   Temp 98 F (36.7 C) (Oral)   Resp 18   Ht 5\' 8"  (1.727 m)   Wt 97.5 kg   SpO2 100%   BMI 32.69 kg/m   Physical Exam  Constitutional: He appears well-developed and well-nourished. No distress.  HENT:  Head: Normocephalic and atraumatic.  Eyes: Conjunctivae are normal.  Neck: Normal range of motion.  Cardiovascular: Normal rate, regular rhythm and normal heart sounds.   No murmur heard. Pulmonary/Chest: Effort normal and breath sounds normal. No respiratory distress. He has no wheezes. He has no rales.  Abdominal: Soft. Bowel sounds are normal. He exhibits no distension. There is no tenderness. There is no CVA tenderness.  Musculoskeletal: Normal range of motion.  Neurological: He is alert.  Skin: No pallor.  Psychiatric: He has a normal mood and affect. His behavior is normal.  Nursing note and vitals reviewed.  ED Treatments / Results  DIAGNOSTIC STUDIES: Oxygen Saturation is 100% on RA, normal by my interpretation.   COORDINATION OF CARE: 11:33 PM-Discussed next steps with pt. Pt verbalized understanding and is agreeable with the plan.  Labs (all labs ordered are listed, but only abnormal results are displayed) Labs Reviewed  URINALYSIS, ROUTINE W REFLEX MICROSCOPIC ([facility])   Procedures Procedures   Medications Ordered in ED Medications - No data to display  Initial Impression / Assessment and Plan / ED Course  I have reviewed the triage vital signs and the  nursing notes.  Pertinent lab results that were available during my care of the patient were reviewed by me and considered in my medical decision making (see chart for details).  Clinical Course    Final Clinical Impressions(s) / ED Diagnoses  [age] y.o. male with complaint of bubbles in his urine and no other complaints d/c home and reassured that urine is normal.  Patient request work note stating he was here.   Final diagnoses:  Urine abnormality   New Prescriptions New Prescriptions   No medications on file   I personally performed the services described in this documentation, which was scribed in my presence. The recorded information has been reviewed and is accurate.     [address]Hope HoneoyeM Neese, NP 07/27/16 Rich [id number]    Zadie Rhineonald Wickline, MD 07/27/16 [phone]

## 2016-07-26 NOTE — ED Triage Notes (Signed)
C/o "bubbles in urine" and "foam in urine" for approx 1 month.  Denies pain, foul odor, color change, or any other symptoms.

## 2016-07-27 NOTE — Discharge Instructions (Signed)
Your urine tonight is completely normal. There are no signs of infection, diabetes or other abnormalities. [facility].

## 2017-01-20 ENCOUNTER — Emergency Department (HOSPITAL_COMMUNITY)
Admission: EM | Admit: 2017-01-20 | Discharge: 2017-01-20 | Disposition: A | Discharge status: Home / Self Care | Payer: Self-pay | Attending: Emergency Medicine | Admitting: Emergency Medicine

## 2017-01-20 ENCOUNTER — Encounter (HOSPITAL_COMMUNITY): Payer: Self-pay | Admitting: Emergency Medicine

## 2017-01-20 DIAGNOSIS — R8290 Unspecified abnormal findings in urine: Secondary | ICD-10-CM

## 2017-01-20 DIAGNOSIS — N399 Disorder of urinary system, unspecified: Secondary | ICD-10-CM | POA: Insufficient documentation

## 2017-01-20 DIAGNOSIS — F17200 Nicotine dependence, unspecified, uncomplicated: Secondary | ICD-10-CM | POA: Insufficient documentation

## 2017-01-20 DIAGNOSIS — K4090 Unilateral inguinal hernia, without obstruction or gangrene, not specified as recurrent: Secondary | ICD-10-CM | POA: Insufficient documentation

## 2017-01-20 DIAGNOSIS — R1900 Intra-abdominal and pelvic swelling, mass and lump, unspecified site: Secondary | ICD-10-CM

## 2017-01-20 LAB — URINALYSIS, ROUTINE W REFLEX MICROSCOPIC
Bilirubin Urine: NEGATIVE
Glucose, UA: NEGATIVE mg/dL
KETONES UR: NEGATIVE mg/dL
Leukocytes, UA: NEGATIVE
Nitrite: NEGATIVE
PH: 5 (ref 5.0–8.0)
PROTEIN: NEGATIVE mg/dL
RBC / HPF: NONE SEEN RBC/hpf (ref 0–5)
Specific Gravity, Urine: 1.026 (ref 1.005–1.030)

## 2017-01-20 MED ORDER — CEPHALEXIN 500 MG PO CAPS: 500.0000 mg | ORAL_CAPSULE | Freq: Four times a day (QID) | ORAL | 0 refills | Status: DC

## 2017-01-20 NOTE — ED Triage Notes (Signed)
Pt reports "foamy urine" for the past few years. States he was seen for this previously with no diagnosis. Denies any pain with urination.

## 2017-01-20 NOTE — ED Provider Notes (Addendum)
[facility] Provider Note   CSN: [id number] Arrival date & time: 01/20/17  0735     History   Chief Complaint Chief Complaint  Patient presents with  . urinary issue    HPI Nicholas Mercer is a 36 y.o. male who presents emergency Department with several complaints. 1) patient states that he has very foamy urine. He states it looks like beer. He is concerned that this is upper normal. He was seen for this about a month ago and told that there was no abnormality. She denies burning, frequency, urgency, polyuria, polydipsia. The patient denies any concern for STI. He does have a single male partner. He states that he does have unprotected sex.  2) patient complains of a bulge in his right groin when he coughs. He denies any pain. He states he only feels it when he strains or cough. He is concerned he may have a hernia. He has not had fever or chills.  HPI  History reviewed. No pertinent past medical history.  There are no active problems to display for this patient.   Past Surgical History:  Procedure Laterality Date  . IRRIGATION AND DEBRIDEMENT ABSCESS  08/21/2011   Procedure: IRRIGATION AND DEBRIDEMENT ABSCESS;  Surgeon: Susy Frizzle, MD;  [facility]: [facility] OR;  Service: ENT;  Laterality: Right;  . KNEE SURGERY         Home Medications    Prior to Admission medications   Medication Sig Start Date End Date Taking? Authorizing Provider  cephALEXin (KEFLEX) 500 MG capsule Take 1 capsule (500 mg total) by mouth 4 (four) times daily. 01/20/17   Arthor Captain, PA-C    Family History No family history on file.  Social History Social History  Substance Use Topics  . Smoking status: Current Some Day Smoker  . Smokeless tobacco: Never Used  . Alcohol use Yes     Allergies   Patient has no known allergies.   Review of Systems Review of Systems Negative for fever, chills, abdominal pain, dysuria, hematuria, genital lesions, penile discharge Positive for  bulge in the right groin.  Physical Exam Updated Vital Signs BP 130/80 (BP Location: Right Arm)   Pulse 70   Temp 97.6 F (36.4 C) (Oral)   Resp 16   SpO2 99%   Physical Exam  Constitutional: He appears well-developed and well-nourished. No distress.  HENT:  Head: Normocephalic and atraumatic.  Eyes: Conjunctivae and EOM are normal. Pupils are equal, round, and reactive to light. No scleral icterus.  Neck: Normal range of motion. Neck supple.  Cardiovascular: Normal rate, regular rhythm and normal heart sounds.   Pulmonary/Chest: Effort normal and breath sounds normal. No respiratory distress.  Abdominal: Soft. He exhibits no distension. There is no tenderness. A hernia is present. Hernia confirmed positive in the right inguinal area.  Genitourinary: Penis normal.  Genitourinary Comments: Hernia in the right groin with the straining. This self reduces without intra-abdominal pressure. No concern for strangulation or incarceration.  Musculoskeletal: He exhibits no edema.  Neurological: He is alert.  Skin: Skin is warm and dry. He is not diaphoretic.  Psychiatric: His behavior is normal.  Nursing note and vitals reviewed.    ED Treatments / Results  Labs (all labs ordered are listed, but only abnormal results are displayed) Labs Reviewed  URINALYSIS, ROUTINE W REFLEX MICROSCOPIC - Abnormal; Notable for the following:       Result Value   Hgb urine dipstick SMALL (*)    Bacteria, UA RARE (*)  Squamous Epithelial / LPF 0-5 (*)    All other components within normal limits  GC/CHLAMYDIA PROBE AMP () NOT AT [facility]    EKG  EKG Interpretation None       Radiology No results found.  Procedures Procedures (including critical care time)  Medications Ordered in ED Medications - No data to display   Initial Impression / Assessment and Plan / ED Course  I have reviewed the triage vital signs and the nursing notes.  Pertinent labs & imaging results that were  available during my care of the patient were reviewed by me and considered in my medical decision making (see chart for details).     paitent with urinary sxs Althoug I feel the Urine appears to be contaminated, will treat as uti given the patient's level of concern even after reassurance/. G/c chlamydia pending. Patient will follow with [facility] for eval his hernia. Discussed return precautions. Stable at d/c  Final Clinical Impressions(s) / ED Diagnoses   Final diagnoses:  Urine abnormality  Groin fullness    New Prescriptions Discharge Medication List as of 01/20/2017  9:31 AM       Arthor CaptainHarris, Jacarius Handel, PA-C 01/20/17 2029    Lavera GuiseLiu, Dana Duo, MD 01/23/17 1332    Arthor CaptainHarris, Wenonah Milo, PA-C 01/28/17 1052    Lavera GuiseLiu, Dana Duo, MD 01/29/17 [phone]

## 2017-01-20 NOTE — ED Notes (Signed)
Pt. Instructed on urine specimen collection.

## 2017-01-20 NOTE — Discharge Instructions (Signed)
Your urine did have a slight amount of blood. It does not appear infected, but there is a small amount of bacteria in her urine, which appears to be contamination, however I am treating your for a Urinary Tract infection. Please take all of the medication. Follow up with [facility] for your hernia evaluation.

## 2017-01-21 LAB — GC/CHLAMYDIA PROBE AMP (~~LOC~~) NOT AT ARMC
Chlamydia: NEGATIVE
Neisseria Gonorrhea: NEGATIVE

## 2019-09-12 ENCOUNTER — Other Ambulatory Visit: Payer: Self-pay

## 2019-09-12 ENCOUNTER — Encounter (HOSPITAL_COMMUNITY): Payer: Self-pay | Admitting: Emergency Medicine

## 2019-09-12 ENCOUNTER — Emergency Department (HOSPITAL_COMMUNITY)
Admission: EM | Admit: 2019-09-12 | Discharge: 2019-09-12 | Disposition: A | Discharge status: Home / Self Care | Payer: HRSA Program | Attending: Emergency Medicine | Admitting: Emergency Medicine

## 2019-09-12 ENCOUNTER — Emergency Department (HOSPITAL_COMMUNITY): Payer: HRSA Program

## 2019-09-12 DIAGNOSIS — F17210 Nicotine dependence, cigarettes, uncomplicated: Secondary | ICD-10-CM | POA: Diagnosis not present

## 2019-09-12 DIAGNOSIS — R05 Cough: Secondary | ICD-10-CM | POA: Insufficient documentation

## 2019-09-12 DIAGNOSIS — Z20822 Contact with and (suspected) exposure to covid-19: Secondary | ICD-10-CM | POA: Insufficient documentation

## 2019-09-12 DIAGNOSIS — R058 Other specified cough: Secondary | ICD-10-CM

## 2019-09-12 LAB — POC SARS CORONAVIRUS 2 AG -  ED: SARS Coronavirus 2 Ag: NEGATIVE

## 2019-09-12 MED ORDER — FLUTICASONE PROPIONATE 50 MCG/ACT NA SUSP: 1.0000 | Freq: Every day | NASAL | 0 refills | Status: DC

## 2019-09-12 MED ORDER — BENZONATATE 100 MG PO CAPS: 100.0000 mg | ORAL_CAPSULE | Freq: Three times a day (TID) | ORAL | 0 refills | Status: DC | PRN

## 2019-09-12 NOTE — ED Provider Notes (Signed)
[facility] EMERGENCY DEPARTMENT Provider Note   CSN: [id number] Arrival date & time: 09/12/19  0818     History Chief Complaint  Patient presents with  . COVID testing  . Cough    Nicholas Mercer is a 39 y.o. male with a hx of tobacco abuse who presents to the ED with complaints of cough x 6 days. Patient reports cough intermittently productive of yellow phlegm sputum with associated fever to 102, chills, body aches, fatigue, & nasal congestion. No alleviating/aggravating factors to his sxs. Grandson whom he saw day prior to onset of sxs recently tested positive for COVID 19. Patient denies chest pain, dyspnea, unilateral leg pain/swelling, hemoptysis, ear pain, or sore throat.   HPI     History reviewed. No pertinent past medical history.  There are no problems to display for this patient.   Past Surgical History:  Procedure Laterality Date  . IRRIGATION AND DEBRIDEMENT ABSCESS  08/21/2011   Procedure: IRRIGATION AND DEBRIDEMENT ABSCESS;  Surgeon: Beckie Salts, MD;  Location: [facility];  Service: ENT;  Laterality: Right;  . KNEE SURGERY         No family history on file.  Social History   Tobacco Use  . Smoking status: Current Some Day Smoker  . Smokeless tobacco: Never Used  Substance Use Topics  . Alcohol use: Yes  . Drug use: Yes    Types: Marijuana    Home Medications Prior to Admission medications   Medication Sig Start Date End Date Taking? Authorizing Provider  cephALEXin (KEFLEX) 500 MG capsule Take 1 capsule (500 mg total) by mouth 4 (four) times daily. 01/20/17   Margarita Mail, PA-C    Allergies    Patient has no known allergies.  Review of Systems   Review of Systems  Constitutional: Positive for chills, fatigue and fever.  HENT: Positive for congestion. Negative for ear pain and sore throat.   Respiratory: Positive for cough. Negative for shortness of breath.   Cardiovascular: Negative for chest pain and leg swelling.    Gastrointestinal: Negative for abdominal pain and vomiting.  Neurological: Negative for syncope.    Physical Exam Updated Vital Signs BP 120/89 (BP Location: Left Arm)   Pulse 82   Temp 98.5 F (36.9 C) (Oral)   Resp 16   SpO2 98%   Physical Exam Vitals and nursing note reviewed.  Constitutional:      General: He is not in acute distress.    Appearance: He is well-developed. He is not toxic-appearing.  HENT:     Head: Normocephalic and atraumatic.     Right Ear: Ear canal normal. Tympanic membrane is not perforated, erythematous, retracted or bulging.     Left Ear: Ear canal normal. Tympanic membrane is not perforated, erythematous, retracted or bulging.     Ears:     Comments: No mastoid erythema/swellng/tenderness.     Nose:     Right Sinus: No maxillary sinus tenderness or frontal sinus tenderness.     Left Sinus: No maxillary sinus tenderness or frontal sinus tenderness.     Mouth/Throat:     Pharynx: Oropharynx is clear. Uvula midline. No oropharyngeal exudate or posterior oropharyngeal erythema.     Comments: Posterior oropharynx is symmetric appearing. Patient tolerating own secretions without difficulty. No trismus. No drooling. No hot potato voice. No swelling beneath the tongue, submandibular compartment is soft.  Eyes:     General:        Right eye: No discharge.  Left eye: No discharge.     Conjunctiva/sclera: Conjunctivae normal.  Cardiovascular:     Rate and Rhythm: Normal rate and regular rhythm.  Pulmonary:     Effort: Pulmonary effort is normal. No respiratory distress.     Breath sounds: Normal breath sounds. No wheezing, rhonchi or rales.  Abdominal:     General: There is no distension.     Palpations: Abdomen is soft.     Tenderness: There is no abdominal tenderness. There is no guarding or rebound.  Musculoskeletal:     Cervical back: Neck supple. No rigidity.  Lymphadenopathy:     Cervical: No cervical adenopathy.  Skin:    General: Skin  is warm and dry.     Findings: No rash.  Neurological:     Mental Status: He is alert.  Psychiatric:        Behavior: Behavior normal.     ED Results / Procedures / Treatments   Labs (all labs ordered are listed, but only abnormal results are displayed) Labs Reviewed  NOVEL CORONAVIRUS, NAA (HOSP ORDER, SEND-OUT TO REF LAB; TAT 18-24 HRS)  POC SARS CORONAVIRUS 2 AG -  ED    EKG None  Radiology DG Chest Port 1 View  Result Date: 09/12/2019 CLINICAL DATA:  Cough, fever and runny nose. EXAM: PORTABLE CHEST 1 VIEW COMPARISON:  None. FINDINGS: The heart size and mediastinal contours are within normal limits. The lungs are clear. No pneumothorax or significant pleural effusion. The visualized skeletal structures are unremarkable. IMPRESSION: No active disease in the chest. Electronically Signed   By: Emmaline Kluver M.D.   On: 09/12/2019 11:33    Procedures Procedures (including critical care time)  Medications Ordered in ED Medications - No data to display  ED Course  I have reviewed the triage vital signs and the nursing notes.  Pertinent labs & imaging results that were available during my care of the patient were reviewed by me and considered in my medical decision making (see chart for details).    Nicholas Mercer was evaluated in Emergency Department on 09/12/2019 for the symptoms described in the history of present illness. He/she was evaluated in the context of the global COVID-19 pandemic, which necessitated consideration that the patient might be at risk for infection with the SARS-CoV-2 virus that causes COVID-19. Institutional protocols and algorithms that pertain to the evaluation of patients at risk for COVID-19 are in a state of rapid change based on information released by regulatory bodies including the CDC and federal and state organizations. These policies and algorithms were followed during the patient's care in the ED.  MDM Rules/Calculators/A&P                       Patient presents to the ED with complaints of cough & congestion with constitutional sxs. Patient nontoxic appearing, vitals WNL. No sinus tenderness, sxs < 10 days, doubt acute bacterial sinusitis. No signs of AOM, AOE/mastoiditis. Oropharynx clear. No meningismus. Lungs CTA. CXR negative for infiltrate. No dyspnea, no respiratory distress. PERC negative. Rapid covid per triage negative, however given exposure and sxs concerning for COVID 19 will send out PCR testing. Discussed quarantine. Supportive care. I discussed results, treatment plan, need for follow-up, and return precautions with the patient. Provided opportunity for questions, patient confirmed understanding and is in agreement with plan.    Final Clinical Impression(s) / ED Diagnoses Final diagnoses:  Cough with exposure to COVID-19 virus    Rx / DC Orders  ED Discharge Orders         Ordered    benzonatate (TESSALON) 100 MG capsule  3 times daily PRN     09/12/19 1155    fluticasone (FLONASE) 50 MCG/ACT nasal spray  Daily     09/12/19 1155           Torina Ey, [place] R, PA-C 09/12/19 1157    Virgina Norfolk, DO 09/12/19 1158

## 2019-09-12 NOTE — ED Triage Notes (Signed)
Grandson COVID +.  Reports cough and runny nose x 3 days.  Reports fever last night.

## 2019-09-12 NOTE — Discharge Instructions (Addendum)
You were seen in the ED today for evaluation of cough with fever. Your chest xray was normal.   Your rapid covid test was negative however we often have false negatives with this test therefore we have sent a more accurate test to evaluate for COVID.   We have tested you for COVID 19, we will call you within the next 72 hours if results are positive, you may also view these results on MyChart.   We are instructing patient's with COVID 19 or symptoms of COVID 19 to quarantine themselves for 14 days. You may be able to discontinue self quarantine if the following conditions are met:   Persons with COVID-19 who have symptoms and were directed to care for themselves at home may discontinue home isolation under the  following conditions: - It has been at least 7 days have passed since symptoms first appeared. - AND at least 3 days (72 hours) have passed since recovery defined as resolution of fever without the use of fever-reducing medications and improvement in respiratory symptoms (e.g., cough, shortness of breath)  Please follow the below quarantine instructions.   We are sending you home with flonase to use 1 spray per nostril daily as needed for nasal congestion & tessalon to use 1 tablet every 8 hours as needed for coughing.   We have prescribed you new medication(s) today. Discuss the medications prescribed today with your pharmacist as they can have adverse effects and interactions with your other medicines including over the counter and prescribed medications. Seek medical evaluation if you start to experience new or abnormal symptoms after taking one of these medicines, seek care immediately if you start to experience difficulty breathing, feeling of your throat closing, facial swelling, or rash as these could be indications of a more serious allergic reaction   Please follow up with primary care within 1 week for re-evaluation- call prior to going to the office to make them aware of your  symptoms as some offices are altering their method of seeing patients with COVID 19 symptoms. Return to the ER for new or worsening symptoms including but not limited to increased work of breathing, chest pain, passing out, or any other concerns.       Person Under Monitoring Name: Nicholas Mercer  Location: [place] [address]   Infection Prevention Recommendations for Individuals Confirmed to have, or Being Evaluated for, 2019 Novel Coronavirus (COVID-19) Infection Who Receive Care at Home  Individuals who are confirmed to have, or are being evaluated for, COVID-19 should follow the prevention steps below until a healthcare provider or local or state health department says they can return to normal activities.  Stay home except to get medical care You should restrict activities outside your home, except for getting medical care. Do not go to work, school, or public areas, and do not use public transportation or taxis.  Call ahead before visiting your doctor Before your medical appointment, call the healthcare provider and tell them that you have, or are being evaluated for, COVID-19 infection. This will help the healthcare provider's office take steps to keep other people from getting infected. Ask your healthcare provider to call the local or state health department.  Monitor your symptoms Seek prompt medical attention if your illness is worsening (e.g., difficulty breathing). Before going to your medical appointment, call the healthcare provider and tell them that you have, or are being evaluated for, COVID-19 infection. Ask your healthcare provider to call  the local or state health department.  Wear a facemask You should wear a facemask that covers your nose and mouth when you are in the same room with other people and when you visit a healthcare provider. People who live with or visit you should also wear a facemask while they are in  the same room with you.  Separate yourself from other people in your home As much as possible, you should stay in a different room from other people in your home. Also, you should use a separate bathroom, if available.  Avoid sharing household items You should not share dishes, drinking glasses, cups, eating utensils, towels, bedding, or other items with other people in your home. After using these items, you should wash them thoroughly with soap and water.  Cover your coughs and sneezes Cover your mouth and nose with a tissue when you cough or sneeze, or you can cough or sneeze into your sleeve. Throw used tissues in a lined trash can, and immediately wash your hands with soap and water for at least 20 seconds or use an alcohol-based hand rub.  Wash your [facility] your hands often and thoroughly with soap and water for at least 20 seconds. You can use an alcohol-based hand sanitizer if soap and water are not available and if your hands are not visibly dirty. Avoid touching your eyes, nose, and mouth with unwashed hands.   Prevention Steps for Caregivers and Household Members of Individuals Confirmed to have, or Being Evaluated for, COVID-19 Infection Being Cared for in the Home  If you live with, or provide care at home for, a person confirmed to have, or being evaluated for, COVID-19 infection please follow these guidelines to prevent infection:  Follow healthcare provider's instructions Make sure that you understand and can help the patient follow any healthcare provider instructions for all care.  Provide for the patient's basic needs You should help the patient with basic needs in the home and provide support for getting groceries, prescriptions, and other personal needs.  Monitor the patient's symptoms If they are getting sicker, call his or her medical provider and tell them that the patient has, or is being evaluated for, COVID-19 infection. This will help the healthcare  provider's office take steps to keep other people from getting infected. Ask the healthcare provider to call the local or state health department.  Limit the number of people who have contact with the patient If possible, have only one caregiver for the patient. Other household members should stay in another home or place of residence. If this is not possible, they should stay in another room, or be separated from the patient as much as possible. Use a separate bathroom, if available. Restrict visitors who do not have an essential need to be in the home.  Keep older adults, very young children, and other sick people away from the patient Keep older adults, very young children, and those who have compromised immune systems or chronic health conditions away from the patient. This includes people with chronic heart, lung, or kidney conditions, diabetes, and cancer.  Ensure good ventilation Make sure that shared spaces in the home have good air flow, such as from an air conditioner or an opened window, weather permitting.  Wash your hands often Wash your hands often and thoroughly with soap and water for at least 20 seconds. You can use an alcohol based hand sanitizer if soap and water are not available and if your hands are not visibly  dirty. Avoid touching your eyes, nose, and mouth with unwashed hands. Use disposable paper towels to dry your hands. If not available, use dedicated cloth towels and replace them when they become wet.  Wear a facemask and gloves Wear a disposable facemask at all times in the room and gloves when you touch or have contact with the patient's blood, body fluids, and/or secretions or excretions, such as sweat, saliva, sputum, nasal mucus, vomit, urine, or feces.  Ensure the mask fits over your nose and mouth tightly, and do not touch it during use. Throw out disposable facemasks and gloves after using them. Do not reuse. Wash your hands immediately after removing your  facemask and gloves. If your personal clothing becomes contaminated, carefully remove clothing and launder. Wash your hands after handling contaminated clothing. Place all used disposable facemasks, gloves, and other waste in a lined container before disposing them with other household waste. Remove gloves and wash your hands immediately after handling these items.  Do not share dishes, glasses, or other household items with the patient Avoid sharing household items. You should not share dishes, drinking glasses, cups, eating utensils, towels, bedding, or other items with a patient who is confirmed to have, or being evaluated for, COVID-19 infection. After the person uses these items, you should wash them thoroughly with soap and water.  Wash laundry thoroughly Immediately remove and wash clothes or bedding that have blood, body fluids, and/or secretions or excretions, such as sweat, saliva, sputum, nasal mucus, vomit, urine, or feces, on them. Wear gloves when handling laundry from the patient. Read and follow directions on labels of laundry or clothing items and detergent. In general, wash and dry with the warmest temperatures recommended on the label.  Clean all areas the individual has used often Clean all touchable surfaces, such as counters, tabletops, doorknobs, bathroom fixtures, toilets, phones, keyboards, tablets, and bedside tables, every day. Also, clean any surfaces that may have blood, body fluids, and/or secretions or excretions on them. Wear gloves when cleaning surfaces the patient has come in contact with. Use a diluted bleach solution (e.g., dilute bleach with 1 part bleach and 10 parts water) or a household disinfectant with a label that says EPA-registered for coronaviruses. To make a bleach solution at home, add 1 tablespoon of bleach to 1 quart (4 cups) of water. For a larger supply, add  cup of bleach to 1 gallon (16 cups) of water. Read labels of cleaning products and  follow recommendations provided on product labels. Labels contain instructions for safe and effective use of the cleaning product including precautions you should take when applying the product, such as wearing gloves or eye protection and making sure you have good ventilation during use of the product. Remove gloves and wash hands immediately after cleaning.  Monitor yourself for signs and symptoms of illness Caregivers and household members are considered close contacts, should monitor their health, and will be asked to limit movement outside of the home to the extent possible. Follow the monitoring steps for close contacts listed on the symptom monitoring form.   ? If you have additional questions, contact your local health department or call the epidemiologist on call at [phone] (available 24/7). ? This guidance is subject to change. For the most up-to-date guidance from [facility], please refer to their website: YouBlogs.pl

## 2019-09-13 LAB — NOVEL CORONAVIRUS, NAA (HOSP ORDER, SEND-OUT TO REF LAB; TAT 18-24 HRS): SARS-CoV-2, NAA: NOT DETECTED

## 2020-09-20 IMAGING — DX DG CHEST 1V PORT
1 series · 1 of 1 positions shown · non-contrast
Comparison: None.

CLINICAL DATA: Cough, fever and runny nose.

EXAM:
PORTABLE CHEST 1 VIEW

[chest ap]
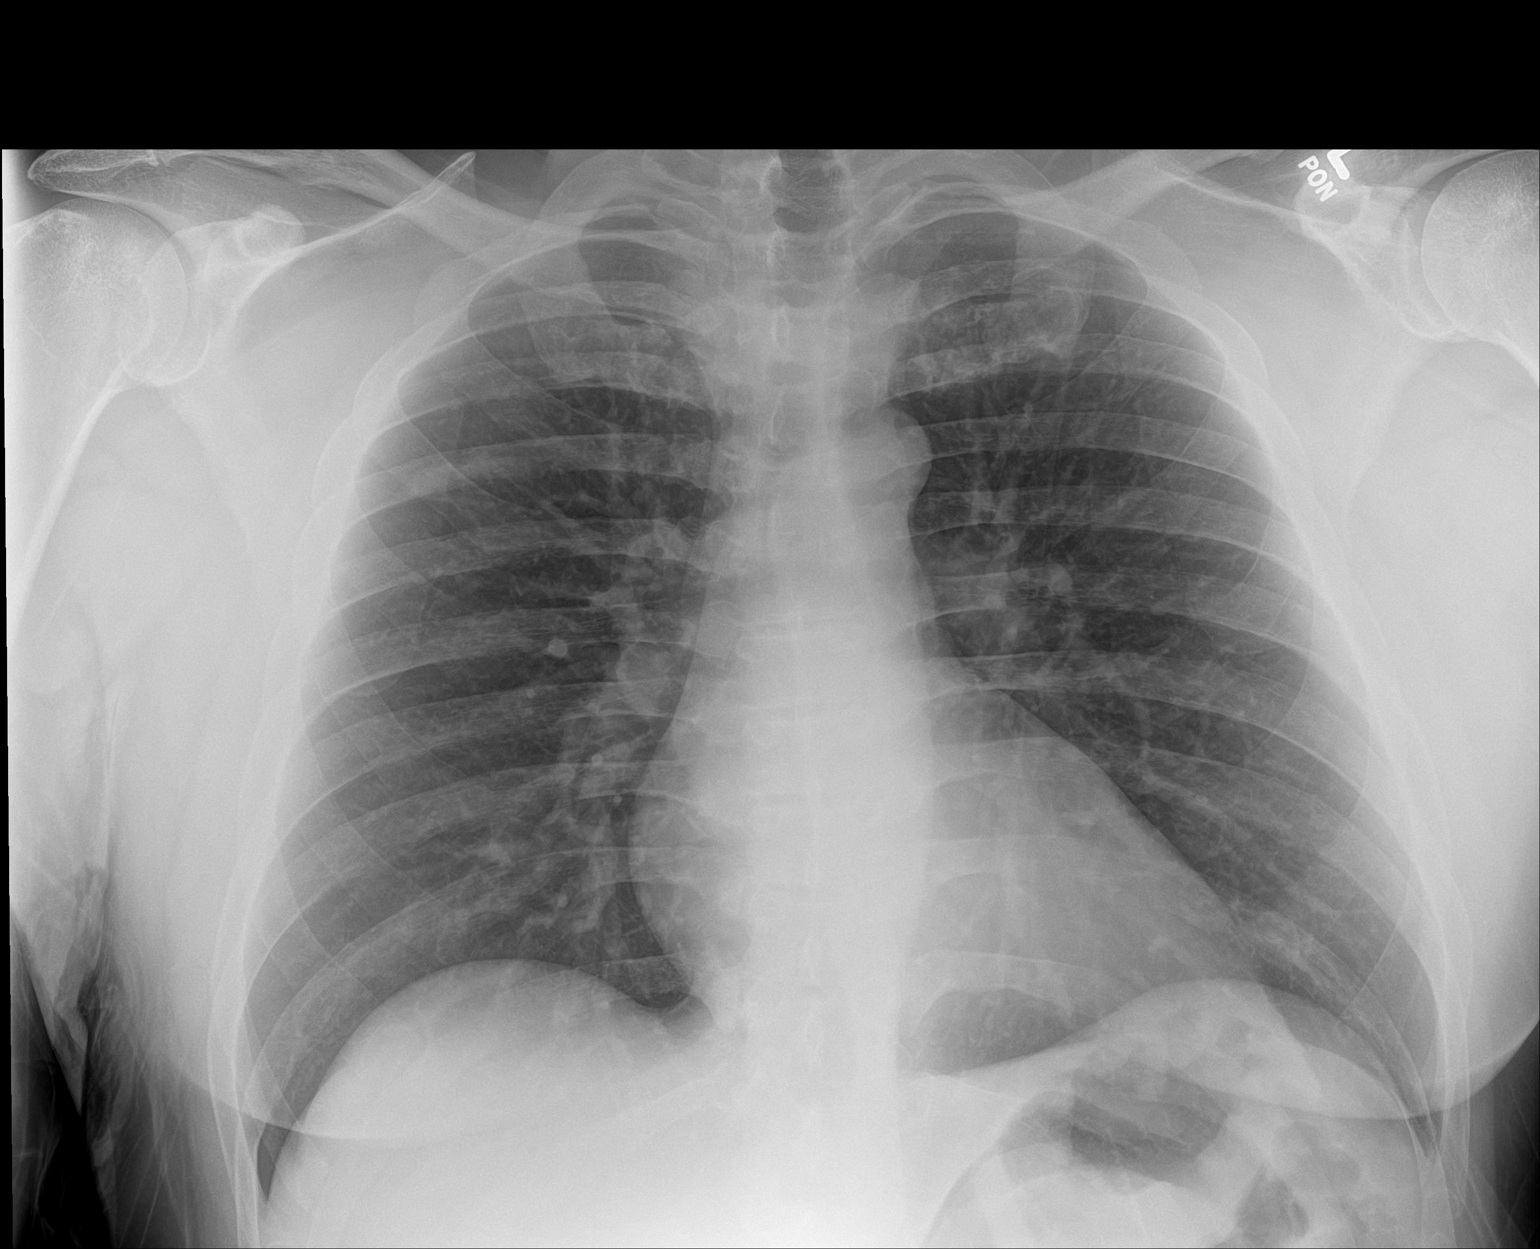

[1 of 1 positions shown; findings below may reference images not displayed]

FINDINGS: The heart size and mediastinal contours are within normal limits.
The lungs are clear. No pneumothorax or significant pleural
effusion. The visualized skeletal structures are unremarkable.
IMPRESSION: No active disease in the chest.

## 2021-02-19 ENCOUNTER — Other Ambulatory Visit: Payer: Self-pay

## 2021-02-19 ENCOUNTER — Ambulatory Visit (HOSPITAL_COMMUNITY)
Admission: EM | Admit: 2021-02-19 | Discharge: 2021-02-19 | Disposition: A | Discharge status: Home / Self Care | Payer: Self-pay | Attending: Physician Assistant | Admitting: Physician Assistant

## 2021-02-19 ENCOUNTER — Encounter (HOSPITAL_COMMUNITY): Payer: Self-pay

## 2021-02-19 DIAGNOSIS — J069 Acute upper respiratory infection, unspecified: Secondary | ICD-10-CM

## 2021-02-19 DIAGNOSIS — U071 COVID-19: Secondary | ICD-10-CM | POA: Insufficient documentation

## 2021-02-19 DIAGNOSIS — F17210 Nicotine dependence, cigarettes, uncomplicated: Secondary | ICD-10-CM | POA: Insufficient documentation

## 2021-02-19 DIAGNOSIS — R8290 Unspecified abnormal findings in urine: Secondary | ICD-10-CM | POA: Insufficient documentation

## 2021-02-19 DIAGNOSIS — R350 Frequency of micturition: Secondary | ICD-10-CM | POA: Insufficient documentation

## 2021-02-19 DIAGNOSIS — R6883 Chills (without fever): Secondary | ICD-10-CM | POA: Insufficient documentation

## 2021-02-19 DIAGNOSIS — Z28310 Unvaccinated for covid-19: Secondary | ICD-10-CM | POA: Insufficient documentation

## 2021-02-19 LAB — POCT URINALYSIS DIPSTICK, ED / UC
Bilirubin Urine: NEGATIVE
Glucose, UA: NEGATIVE mg/dL
Ketones, ur: NEGATIVE mg/dL
Leukocytes,Ua: NEGATIVE
Nitrite: NEGATIVE
Protein, ur: NEGATIVE mg/dL
Specific Gravity, Urine: 1.02 (ref 1.005–1.030)
Urobilinogen, UA: 0.2 mg/dL (ref 0.0–1.0)
pH: 5.5 (ref 5.0–8.0)

## 2021-02-19 LAB — SARS CORONAVIRUS 2 (TAT 6-24 HRS): SARS Coronavirus 2: POSITIVE — AB

## 2021-02-19 LAB — POC INFLUENZA A AND B ANTIGEN (URGENT CARE ONLY)
INFLUENZA A ANTIGEN, POC: NEGATIVE
INFLUENZA B ANTIGEN, POC: NEGATIVE

## 2021-02-19 MED ORDER — FLUTICASONE PROPIONATE 50 MCG/ACT NA SUSP: 1.0000 | Freq: Every day | NASAL | 0 refills | Status: DC

## 2021-02-19 MED ORDER — BENZONATATE 100 MG PO CAPS: 100.0000 mg | ORAL_CAPSULE | Freq: Three times a day (TID) | ORAL | 0 refills | Status: DC | PRN

## 2021-02-19 MED ORDER — PROMETHAZINE-DM 6.25-15 MG/5ML PO SYRP: 5.0000 mL | ORAL_SOLUTION | Freq: Three times a day (TID) | ORAL | 0 refills | Status: DC | PRN

## 2021-02-19 NOTE — ED Provider Notes (Signed)
[facility]    CSN: [id number] Arrival date & time: 02/19/21  1001      History   Chief Complaint Chief Complaint  Patient presents with   Weakness   Fatigue   Neck Pain   Chills   Ear and Forehead Pressure   Dizziness    HPI Nicholas Mercer is a 40 y.o. male.   Patient presents today with a 12-hour history of URI symptoms.  Reports waking up around 1 AM this morning with chills, cough, body aches, headache, dizziness, fatigue.  Denies any fever, chest pain, shortness of breath, nausea, vomiting.  He has not tried any over-the-counter medications for symptom management.  Denies any known sick contacts.  He has not had influenza or COVID-19 vaccinations.  He is a current everyday smoker smoking is typical amount.  Denies history of allergies, asthma, COPD.  He is having difficulty with daily activities as result of symptoms.  He also reports frequent urination overnight and is unsure if this is because he cannot sleep or an underlying issue.  He denies any dysuria, urinary urgency, penile discharge, pelvic pain.  Denies any recent antibiotic use.   History reviewed. No pertinent past medical history.  There are no problems to display for this patient.   Past Surgical History:  Procedure Laterality Date   IRRIGATION AND DEBRIDEMENT ABSCESS  08/21/2011   Procedure: IRRIGATION AND DEBRIDEMENT ABSCESS;  Surgeon: Susy Frizzle, MD;  Location: [facility];  Service: ENT;  Laterality: Right;   KNEE SURGERY         Home Medications    Prior to Admission medications   Medication Sig Start Date End Date Taking? Authorizing Provider  promethazine-dextromethorphan (PROMETHAZINE-DM) 6.25-15 MG/5ML syrup Take 5 mLs by mouth 3 (three) times daily as needed for cough. 02/19/21  Yes Frayda Egley K, PA-C  benzonatate (TESSALON) 100 MG capsule Take 1 capsule (100 mg total) by mouth 3 (three) times daily as needed for cough. 02/19/21   Shamell Hittle, Denny Peon K, PA-C  fluticasone (FLONASE) 50  MCG/ACT nasal spray Place 1 spray into both nostrils daily. 02/19/21   Jamerson Vonbargen, Noberto Retort, PA-C    Family History Family History  Problem Relation Age of Onset   Diabetes Maternal Uncle     Social History Social History   Tobacco Use   Smoking status: Some Days    Packs/day: 0.33    Pack years: 0.00    Types: Cigarettes   Smokeless tobacco: Never  Vaping Use   Vaping Use: Former  Substance Use Topics   Alcohol use: Yes    Comment: socially   Drug use: Yes    Types: Marijuana    Comment: here and there per pt     Allergies   Shellfish allergy   Review of Systems Review of Systems  Constitutional:  Positive for activity change, appetite change, chills and fatigue. Negative for fever.  HENT:  Positive for congestion, postnasal drip and sinus pressure. Negative for sneezing and sore throat.   Respiratory:  Positive for cough. Negative for shortness of breath.   Cardiovascular:  Negative for chest pain.  Gastrointestinal:  Negative for abdominal pain, diarrhea, nausea and vomiting.  Musculoskeletal:  Positive for arthralgias and myalgias.  Neurological:  Positive for dizziness and headaches. Negative for light-headedness.    Physical Exam Triage Vital Signs ED Triage Vitals  Enc Vitals Group     BP 02/19/21 1045 115/81     Pulse Rate 02/19/21 1045 92  Resp 02/19/21 1045 18     Temp 02/19/21 1045 99.3 F (37.4 C)     Temp Source 02/19/21 1045 Oral     SpO2 02/19/21 1045 99 %     Weight --      Height --      Head Circumference --      Peak Flow --      Pain Score 02/19/21 1040 4     Pain Loc --      Pain Edu? --      Excl. in GC? --    No data found.  Updated Vital Signs BP 115/81 (BP Location: Left Arm)   Pulse 92   Temp 99.3 F (37.4 C) (Oral)   Resp 18   SpO2 99%   Visual Acuity Right Eye Distance:   Left Eye Distance:   Bilateral Distance:    Right Eye Near:   Left Eye Near:    Bilateral Near:     Physical Exam Vitals reviewed.   Constitutional:      General: He is awake.     Appearance: Normal appearance. He is normal weight. He is not ill-appearing.     Comments: Very pleasant male appears stated age in no acute distress  HENT:     Head: Normocephalic and atraumatic.     Right Ear: Tympanic membrane, ear canal and external ear normal. Tympanic membrane is not erythematous or bulging.     Left Ear: Tympanic membrane, ear canal and external ear normal. Tympanic membrane is not erythematous or bulging.     Nose: Nose normal.     Mouth/Throat:     Pharynx: Uvula midline. No oropharyngeal exudate or posterior oropharyngeal erythema.  Cardiovascular:     Rate and Rhythm: Normal rate and regular rhythm.     Heart sounds: Normal heart sounds, S1 normal and S2 normal. No murmur heard. Pulmonary:     Effort: Pulmonary effort is normal. No accessory muscle usage or respiratory distress.     Breath sounds: Normal breath sounds. No stridor. No wheezing, rhonchi or rales.     Comments: Clear to auscultation bilaterally Abdominal:     General: Bowel sounds are normal.     Palpations: Abdomen is soft.     Tenderness: There is no abdominal tenderness.  Lymphadenopathy:     Head:     Right side of head: No submental, submandibular or tonsillar adenopathy.     Left side of head: No submental, submandibular or tonsillar adenopathy.     Cervical: No cervical adenopathy.  Neurological:     Mental Status: He is alert.  Psychiatric:        Behavior: Behavior is cooperative.     UC Treatments / Results  Labs (all labs ordered are listed, but only abnormal results are displayed) Labs Reviewed  POCT URINALYSIS DIPSTICK, ED / UC - Abnormal; Notable for the following components:      Result Value   Hgb urine dipstick TRACE (*)    All other components within normal limits  SARS CORONAVIRUS 2 (TAT 6-24 HRS)  URINE CULTURE  POC INFLUENZA A AND B ANTIGEN (URGENT CARE ONLY)    EKG   Radiology No results  found.  Procedures Procedures (including critical care time)  Medications Ordered in UC Medications - No data to display  Initial Impression / Assessment and Plan / UC Course  I have reviewed the triage vital signs and the nursing notes.  Pertinent labs & imaging results that were available  during my care of the patient were reviewed by me and considered in my medical decision making (see chart for details).     Flu testing was negative.  COVID-19 test is pending.  Suspect viral etiology of symptoms given short duration of symptoms.  No evidence of acute infection on physical exam that warrant initiation of antibiotics.  Patient was prescribed Tessalon, Promethazine DM, Flonase to manage symptoms.  Recommended that he not drive or drink alcohol with Promethazine DM as drowsiness is a common side effect.  He was provided a work excuse note.  Recommended that he rest and drink plenty of fluid.  Discussed alarm symptoms that warrant emergent evaluation.  Strict return precautions given to which patient expressed understanding.  UA showed trace hemoglobin but was otherwise normal.  Will defer antibiotic treatment until urine culture results are obtained.  Discussed with patient that we will contact him in 2 to 3 days if antibiotics are required.  Discussed alarm symptoms that warrant emergent evaluation.  Strict return precautions given to which patient expressed understanding  Final Clinical Impressions(s) / UC Diagnoses   Final diagnoses:  URI with cough and congestion  Chills  Urinary frequency  Abnormal urinalysis     Discharge Instructions      Your flu test was negative.  Your COVID-19 test is pending.  Use Tessalon 3 times a day for cough symptoms.  You can use Promethazine DM for cough but this will make you sleepy so do not drive or drink alcohol while taking this; recommend primarily taking this at bedtime.  Use Flonase for congestion symptoms.  Your urine did not show any sign  of infection but we will send this off for culture.  If we need to start antibiotic we will contact you.  Make sure you are resting and drinking plenty of fluid.  If anything worsens please return for reevaluation.     ED Prescriptions     Medication Sig Dispense Auth. Provider   fluticasone (FLONASE) 50 MCG/ACT nasal spray Place 1 spray into both nostrils daily. 16 g Merlina Marchena K, PA-C   benzonatate (TESSALON) 100 MG capsule Take 1 capsule (100 mg total) by mouth 3 (three) times daily as needed for cough. 21 capsule Jackquelyn Sundberg K, PA-C   promethazine-dextromethorphan (PROMETHAZINE-DM) 6.25-15 MG/5ML syrup Take 5 mLs by mouth 3 (three) times daily as needed for cough. 118 mL Karington Zarazua K, PA-C      PDMP not reviewed this encounter.   Jeani Hawking, PA-C 02/19/21 1241

## 2021-02-19 NOTE — Discharge Instructions (Addendum)
Your flu test was negative.  Your COVID-19 test is pending.  Use Tessalon 3 times a day for cough symptoms.  You can use Promethazine DM for cough but this will make you sleepy so do not drive or drink alcohol while taking this; recommend primarily taking this at bedtime.  Use Flonase for congestion symptoms.  Your urine did not show any sign of infection but we will send this off for culture.  If we need to start antibiotic we will contact you.  Make sure you are resting and drinking plenty of fluid.  If anything worsens please return for reevaluation.

## 2021-02-19 NOTE — ED Triage Notes (Addendum)
Pt c/o fatigue, chills, muscle soreness in neck pressure in ears ears and forehead and generalized weakness. Pt reports some dizziness as well. Pt reports was dizzy in middle of night but no longer. Reports trouble thinking straight and feeling "delusional".

## 2021-02-20 LAB — URINE CULTURE: Culture: NO GROWTH

## 2024-01-13 ENCOUNTER — Encounter (HOSPITAL_COMMUNITY): Payer: Self-pay | Admitting: *Deleted

## 2024-01-13 ENCOUNTER — Other Ambulatory Visit: Payer: Self-pay

## 2024-01-13 ENCOUNTER — Emergency Department (HOSPITAL_COMMUNITY)
Admission: EM | Admit: 2024-01-13 | Discharge: 2024-01-13 | Disposition: A | Discharge status: Home / Self Care | Payer: Self-pay | Attending: Emergency Medicine | Admitting: Emergency Medicine

## 2024-01-13 DIAGNOSIS — R319 Hematuria, unspecified: Secondary | ICD-10-CM | POA: Insufficient documentation

## 2024-01-13 LAB — BASIC METABOLIC PANEL WITH GFR
Anion gap: 8 (ref 5–15)
BUN: 8 mg/dL (ref 6–20)
CO2: 24 mmol/L (ref 22–32)
Calcium: 9.2 mg/dL (ref 8.9–10.3)
Chloride: 107 mmol/L (ref 98–111)
Creatinine, Ser: 1.11 mg/dL (ref 0.61–1.24)
GFR, Estimated: >60 mL/min (ref >60)
Glucose, Bld: 102 mg/dL — ABNORMAL HIGH (ref 70–99)
Potassium: 4.1 mmol/L (ref 3.5–5.1)
Sodium: 139 mmol/L (ref 135–145)

## 2024-01-13 LAB — URINALYSIS, ROUTINE W REFLEX MICROSCOPIC
Bilirubin Urine: NEGATIVE
Glucose, UA: NEGATIVE mg/dL
Hgb urine dipstick: NEGATIVE
Ketones, ur: NEGATIVE mg/dL
Leukocytes,Ua: NEGATIVE
Nitrite: NEGATIVE
Protein, ur: NEGATIVE mg/dL
Specific Gravity, Urine: 1.019 (ref 1.005–1.030)
pH: 5 (ref 5.0–8.0)

## 2024-01-13 LAB — CBC
HCT: 45.6 % (ref 39.0–52.0)
Hemoglobin: 15.3 g/dL (ref 13.0–17.0)
MCH: 30.4 pg (ref 26.0–34.0)
MCHC: 33.6 g/dL (ref 30.0–36.0)
MCV: 90.5 fL (ref 80.0–100.0)
Platelets: 294 10*3/uL (ref 150–400)
RBC: 5.04 MIL/uL (ref 4.22–5.81)
RDW: 13.4 % (ref 11.5–15.5)
WBC: 6.8 10*3/uL (ref 4.0–10.5)
nRBC: 0 % (ref 0.0–0.2)

## 2024-01-13 NOTE — ED Provider Triage Note (Signed)
 Emergency Medicine Provider Triage Evaluation Note  MARKIEL BATTERSBY , a 43 y.o. male  was evaluated in triage.  Pt complains of hematuria. Started this am, no pain or other symptoms. Urine cup at bedside, no obvious bleeding present  Review of Systems  Positive:  Negative:   Physical Exam  BP 123/89 (BP Location: Right Arm)   Pulse 90   Temp 98.7 F (37.1 C)   Resp 16   SpO2 99%  Gen:   Awake, no distress   Resp:  Normal effort  MSK:   Moves extremities without difficulty  Other:    Medical Decision Making  Medically screening exam initiated at 12:16 PM.  Appropriate orders placed.  Collins Dean was informed that the remainder of the evaluation will be completed by another provider, this initial triage assessment does not replace that evaluation, and the importance of remaining in the ED until their evaluation is complete.     Sherra Dk, PA-C 01/13/24 1217

## 2024-01-13 NOTE — ED Provider Notes (Signed)
 [facility] Provider Note   CSN: [id number] Arrival date & time: 01/13/24  1202     History  Chief Complaint  Patient presents with   Hematuria    Nicholas Mercer is a 43 y.o. male.   Hematuria     Patient presented to the emergency room for evaluation of blood in his urine.  Patient states he noticed the symptoms this morning when he went to urinate.  It was pink in color.  He has not been having any trouble with any back pain.  No abdominal pain.  He has not had any fevers or chills.  Patient states sometimes he notices that his urine is very foamy but that has not been associated with this particular episode today.  He denies any recent injuries.  He has not noticed any persistent bleeding.  Home Medications Prior to Admission medications   Medication Sig Start Date End Date Taking? Authorizing Provider  ibuprofen  (ADVIL ) 200 MG tablet Take 400 mg by mouth daily as needed for mild pain (pain score 1-3) or moderate pain (pain score 4-6).   Yes [provider]      Allergies    Shellfish allergy    Review of Systems   Review of Systems  Genitourinary:  Positive for hematuria.    Physical Exam Updated Vital Signs BP 123/89 (BP Location: Right Arm)   Pulse 90   Temp 98.7 F (37.1 C)   Resp 16   SpO2 99%  Physical Exam Vitals and nursing note reviewed.  Constitutional:      General: He is not in acute distress.    Appearance: He is well-developed.  HENT:     Head: Normocephalic and atraumatic.     Right Ear: External ear normal.     Left Ear: External ear normal.  Eyes:     General: No scleral icterus.       Right eye: No discharge.        Left eye: No discharge.     Conjunctiva/sclera: Conjunctivae normal.  Neck:     Trachea: No tracheal deviation.  Cardiovascular:     Rate and Rhythm: Normal rate and regular rhythm.  Pulmonary:     Effort: Pulmonary effort is normal. No respiratory distress.      Breath sounds: Normal breath sounds. No stridor. No wheezing or rales.  Abdominal:     General: Bowel sounds are normal. There is no distension.     Palpations: Abdomen is soft.     Tenderness: There is no abdominal tenderness. There is no guarding or rebound.  Musculoskeletal:        General: No tenderness or deformity.     Cervical back: Neck supple.  Skin:    General: Skin is warm and dry.     Findings: No rash.  Neurological:     General: No focal deficit present.     Mental Status: He is alert.     Cranial Nerves: No cranial nerve deficit, dysarthria or facial asymmetry.     Sensory: No sensory deficit.     Motor: No abnormal muscle tone or seizure activity.     Coordination: Coordination normal.  Psychiatric:        Mood and Affect: Mood normal.     ED Results / Procedures / Treatments   Labs (all labs ordered are listed, but only abnormal results are displayed) Labs Reviewed  BASIC METABOLIC PANEL WITH GFR - Abnormal; Notable for the  following components:      Result Value   Glucose, Bld 102 (*)    All other components within normal limits  URINALYSIS, ROUTINE W REFLEX MICROSCOPIC  CBC    EKG None  Radiology No results found.  Procedures Procedures    Medications Ordered in ED Medications - No data to display  ED Course/ Medical Decision Making/ A&P                                 Medical Decision Making Amount and/or Complexity of Data Reviewed Labs: ordered.  Patient presented to the ED for evaluation of episode of hematuria.  Patient does not have any blood in his urine at this time.  His urinalysis is normal.  No findings of infection.  Patient does not have any signs of acute renal dysfunction.  He is not anemic.  Is not having any pain.  I have low suspicion for kidney stone.  Will recommend outpatient follow-up with urology for further evaluation.  Evaluation and diagnostic testing in the emergency department does not suggest an emergent  condition requiring admission or immediate intervention beyond what has been performed at this time.  The patient is safe for discharge and has been instructed to return immediately for worsening symptoms, change in symptoms or any other concerns.         Final Clinical Impression(s) / ED Diagnoses Final diagnoses:  Hematuria, unspecified type    Rx / DC Orders ED Discharge Orders     None         Trish Furl, MD 01/13/24 1406

## 2024-01-13 NOTE — Discharge Instructions (Addendum)
 Follow-up with a urologist for further evaluation of the episode of blood in your urine as we discussed.  Consider following up with a primary care doctor for routine checkup.  Return to the emergency room for fevers chills severe pain or other concerning symptoms

## 2024-01-13 NOTE — ED Triage Notes (Signed)
 Pt is here for evaluation of blood in urine this am.  No lower back, flank or abdominal pain with this.

## 2024-05-20 ENCOUNTER — Emergency Department (HOSPITAL_COMMUNITY)
Admission: EM | Admit: 2024-05-20 | Discharge: 2024-05-20 | Payer: Self-pay | Attending: Emergency Medicine | Admitting: Emergency Medicine

## 2024-05-20 ENCOUNTER — Emergency Department (HOSPITAL_COMMUNITY): Payer: Self-pay

## 2024-05-20 ENCOUNTER — Encounter (HOSPITAL_COMMUNITY): Payer: Self-pay

## 2024-05-20 ENCOUNTER — Other Ambulatory Visit: Payer: Self-pay

## 2024-05-20 DIAGNOSIS — D72829 Elevated white blood cell count, unspecified: Secondary | ICD-10-CM | POA: Insufficient documentation

## 2024-05-20 DIAGNOSIS — S80212A Abrasion, left knee, initial encounter: Secondary | ICD-10-CM | POA: Insufficient documentation

## 2024-05-20 DIAGNOSIS — Z23 Encounter for immunization: Secondary | ICD-10-CM | POA: Insufficient documentation

## 2024-05-20 DIAGNOSIS — S60512A Abrasion of left hand, initial encounter: Secondary | ICD-10-CM | POA: Insufficient documentation

## 2024-05-20 DIAGNOSIS — S80211A Abrasion, right knee, initial encounter: Secondary | ICD-10-CM | POA: Insufficient documentation

## 2024-05-20 DIAGNOSIS — T148XXA Other injury of unspecified body region, initial encounter: Secondary | ICD-10-CM

## 2024-05-20 DIAGNOSIS — W1839XA Other fall on same level, initial encounter: Secondary | ICD-10-CM | POA: Insufficient documentation

## 2024-05-20 DIAGNOSIS — S0083XA Contusion of other part of head, initial encounter: Secondary | ICD-10-CM | POA: Insufficient documentation

## 2024-05-20 DIAGNOSIS — S40812A Abrasion of left upper arm, initial encounter: Secondary | ICD-10-CM | POA: Insufficient documentation

## 2024-05-20 DIAGNOSIS — S60511A Abrasion of right hand, initial encounter: Secondary | ICD-10-CM | POA: Insufficient documentation

## 2024-05-20 DIAGNOSIS — M25512 Pain in left shoulder: Secondary | ICD-10-CM | POA: Insufficient documentation

## 2024-05-20 DIAGNOSIS — S90811A Abrasion, right foot, initial encounter: Secondary | ICD-10-CM | POA: Insufficient documentation

## 2024-05-20 LAB — CBC WITH DIFFERENTIAL/PLATELET
Abs Immature Granulocytes: 0.09 K/uL — ABNORMAL HIGH (ref 0.00–0.07)
Basophils Absolute: 0 K/uL (ref 0.0–0.1)
Basophils Relative: 0 %
Eosinophils Absolute: 0 K/uL (ref 0.0–0.5)
Eosinophils Relative: 0 %
HCT: 49.1 % (ref 39.0–52.0)
Hemoglobin: 16.4 g/dL (ref 13.0–17.0)
Immature Granulocytes: 1 %
Lymphocytes Relative: 14 %
Lymphs Abs: 1.7 K/uL (ref 0.7–4.0)
MCH: 31.3 pg (ref 26.0–34.0)
MCHC: 33.4 g/dL (ref 30.0–36.0)
MCV: 93.7 fL (ref 80.0–100.0)
Monocytes Absolute: 0.9 K/uL (ref 0.1–1.0)
Monocytes Relative: 8 %
Neutro Abs: 9.1 K/uL — ABNORMAL HIGH (ref 1.7–7.7)
Neutrophils Relative %: 77 %
Platelets: 314 K/uL (ref 150–400)
RBC: 5.24 MIL/uL (ref 4.22–5.81)
RDW: 13.4 % (ref 11.5–15.5)
WBC: 11.8 K/uL — ABNORMAL HIGH (ref 4.0–10.5)
nRBC: 0 % (ref 0.0–0.2)

## 2024-05-20 LAB — BASIC METABOLIC PANEL WITH GFR
Anion gap: 10 (ref 5–15)
BUN: 11 mg/dL (ref 6–20)
CO2: 21 mmol/L — ABNORMAL LOW (ref 22–32)
Calcium: 9.2 mg/dL (ref 8.9–10.3)
Chloride: 108 mmol/L (ref 98–111)
Creatinine, Ser: 1.17 mg/dL (ref 0.61–1.24)
GFR, Estimated: >60 mL/min (ref >60)
Glucose, Bld: 105 mg/dL — ABNORMAL HIGH (ref 70–99)
Potassium: 4.4 mmol/L (ref 3.5–5.1)
Sodium: 139 mmol/L (ref 135–145)

## 2024-05-20 MED ORDER — MUPIROCIN CALCIUM 2 % EX CREA: 1.0000 | TOPICAL_CREAM | Freq: Two times a day (BID) | CUTANEOUS | 0 refills | Status: AC

## 2024-05-20 MED ORDER — TETANUS-DIPHTH-ACELL PERTUSSIS 5-2.5-18.5 LF-MCG/0.5 IM SUSY
0.5000 mL | PREFILLED_SYRINGE | Freq: Once | INTRAMUSCULAR | Status: AC
  Administered 2024-05-20: 0.5 mL via INTRAMUSCULAR
  Filled 2024-05-20: qty 0.5

## 2024-05-20 MED ORDER — NAPROXEN 500 MG PO TABS: 500.0000 mg | ORAL_TABLET | Freq: Two times a day (BID) | ORAL | 0 refills | Status: AC

## 2024-05-20 NOTE — ED Notes (Signed)
 Wound care to all abrasions and road rash with wound cleanser xeroform nonadherent and kling.
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
## 2024-05-20 NOTE — Discharge Instructions (Addendum)
 Your imaging and lab work is overall reassuring.  You can apply Neosporin or Bactroban  twice daily to abrasion to help prevent infection. Was with gentle soap and water. Keep area cover with bandage.Return to ER with new or worsening symptoms.   Wound care Clean your wound 1-2 times a day, or as told by your provider. Wash your hands for at least 20 seconds with mild soap and water. Do this before and after you clean your wound. If soap and water are not available, use hand sanitizer to clean your hands. Wash your wound using mild soap and water, a wound cleanser, or a saltwater solution. A saltwater solution is also called saline. Do not use hydrogen peroxide or alcohol. These can slow healing. Rinse off the soap. Pat your wound dry with a clean towel. Do not rub your wound. Put an antibiotic ointment on your wound as told by your provider. You may also be told to put on a jelly that prevents moisture from entering the wound. Cover your wound with a bandage as told by your provider. Small or very minor abrasions may not need a bandage. Keep your bandage clean and dry as told by your provider. There are many ways to close and cover a wound. Follow instructions from your provider about changing and removing your bandage. You may have to change your bandage one or more times a day, or as told by your provider. Check your wound every day for signs of infection. Check for: Redness. Watch for a red streak that spreads out from your wound. Swelling or worse pain. Warmth. Blood, fluid, pus, or a bad smell.

## 2024-07-29 ENCOUNTER — Other Ambulatory Visit: Payer: Self-pay

## 2024-07-29 ENCOUNTER — Encounter (HOSPITAL_COMMUNITY): Payer: Self-pay | Admitting: Radiology

## 2024-07-29 ENCOUNTER — Emergency Department (HOSPITAL_COMMUNITY)
Admission: EM | Admit: 2024-07-29 | Discharge: 2024-07-30 | Discharge status: Against Medical Advice | Payer: Self-pay | Attending: Emergency Medicine | Admitting: Emergency Medicine

## 2024-07-29 DIAGNOSIS — K0889 Other specified disorders of teeth and supporting structures: Secondary | ICD-10-CM | POA: Insufficient documentation

## 2024-07-29 DIAGNOSIS — Z5321 Procedure and treatment not carried out due to patient leaving prior to being seen by health care provider: Secondary | ICD-10-CM | POA: Insufficient documentation

## 2024-07-29 DIAGNOSIS — K0381 Cracked tooth: Secondary | ICD-10-CM | POA: Insufficient documentation

## 2024-07-29 HISTORY — DX: Hyperlipidemia, unspecified: E78.5

## 2024-07-29 NOTE — ED Notes (Signed)
 Family at bedside.

## 2024-07-29 NOTE — ED Triage Notes (Signed)
 Pt states that he began having lower right side dental pain yesterday. HE has been taking Advil  and it hasn't helped. He believes that he has a dental abscess.

## 2024-07-29 NOTE — ED Provider Triage Note (Signed)
 Emergency Medicine Provider Triage Evaluation Note  ELIJAH PHOMMACHANH , a 43 y.o. male  was evaluated in triage.  Pt complains of right lower dental pain since yesterday..  Review of Systems  Positive: Right lower dental pain Negative: Fever, chills, nausea, vomiting  Physical Exam  BP (!) 144/86 (BP Location: Right Arm)   Pulse 90   Temp 98.3 F (36.8 C)   Resp 16   Ht 5' 8 (1.727 m)   Wt 92.5 kg   SpO2 100%   BMI 31.02 kg/m  Gen:   Awake, no distress   Resp:  Normal effort  MSK:   Moves extremities without difficulty  Other:  Broken lower R molar, no obvious abscess   Medical Decision Making  Medically screening exam initiated at 4:20 PM.  Appropriate orders placed.  Lamar LITTIE Slade was informed that the remainder of the evaluation will be completed by another provider, this initial triage assessment does not replace that evaluation, and the importance of remaining in the ED until their evaluation is complete.     Francis Ileana SAILOR, PA-C 07/29/24 1705

## 2024-07-29 NOTE — ED Notes (Signed)
Pt called multiple times, no response  

## 2024-07-30 ENCOUNTER — Emergency Department (HOSPITAL_COMMUNITY)
Admission: EM | Admit: 2024-07-30 | Discharge: 2024-07-30 | Disposition: A | Discharge status: Home / Self Care | Payer: Self-pay | Attending: Emergency Medicine | Admitting: Emergency Medicine

## 2024-07-30 ENCOUNTER — Other Ambulatory Visit: Payer: Self-pay

## 2024-07-30 DIAGNOSIS — K029 Dental caries, unspecified: Secondary | ICD-10-CM | POA: Insufficient documentation

## 2024-07-30 DIAGNOSIS — K047 Periapical abscess without sinus: Secondary | ICD-10-CM | POA: Insufficient documentation

## 2024-07-30 MED ORDER — HYDROCODONE-ACETAMINOPHEN 5-325 MG PO TABS
1.0000 | ORAL_TABLET | Freq: Once | ORAL | Status: AC
  Administered 2024-07-30: 1 via ORAL
  Filled 2024-07-30: qty 1

## 2024-07-30 MED ORDER — HYDROCODONE-ACETAMINOPHEN 5-325 MG PO TABS: 1.0000 | ORAL_TABLET | Freq: Four times a day (QID) | ORAL | 0 refills | Status: AC | PRN

## 2024-07-30 MED ORDER — AMOXICILLIN-POT CLAVULANATE 875-125 MG PO TABS
1.0000 | ORAL_TABLET | Freq: Once | ORAL | Status: AC
  Administered 2024-07-30: 1 via ORAL
  Filled 2024-07-30: qty 1

## 2024-07-30 MED ORDER — AMOXICILLIN-POT CLAVULANATE 875-125 MG PO TABS: 1.0000 | ORAL_TABLET | Freq: Two times a day (BID) | ORAL | 0 refills | Status: AC

## 2024-07-30 NOTE — ED Provider Notes (Cosign Needed Addendum)
  EMERGENCY DEPARTMENT AT [facility] Provider Note   CSN: [id number] Arrival date & time: 07/30/24  2047     Patient presents with: Dental Pain   Nicholas Mercer is a 43 y.o. male.   Patient presents today with right lower jaw swelling and pain.  Symptoms started about 3 days ago.  No drainage.  Patient has been taking over-the-counter medications without much improvement.  No difficulty breathing or swallowing.  He reports breaking a tooth about 3 months ago.       Prior to Admission medications   Medication Sig Start Date End Date Taking? Authorizing Provider  ibuprofen  (ADVIL ) 200 MG tablet Take 200-800 mg by mouth daily as needed for mild pain (pain score 1-3) or moderate pain (pain score 4-6).    [provider]  mupirocin  cream (BACTROBAN ) 2 % Apply 1 Application topically 2 (two) times daily. 05/20/24   Barrett, Warren SAILOR, PA-C  naproxen  (NAPROSYN ) 500 MG tablet Take 1 tablet (500 mg total) by mouth 2 (two) times daily. 05/20/24   Barrett, Warren SAILOR, PA-C    Allergies: Shellfish allergy    Review of Systems  Updated Vital Signs BP (!) 159/90   Pulse 94   Temp 98 F (36.7 C)   Resp 16   SpO2 100%   Physical Exam Vitals and nursing note reviewed.  Constitutional:      Appearance: He is well-developed.  HENT:     Head: Normocephalic and atraumatic.     Jaw: No trismus.     Right Ear: Tympanic membrane, ear canal and external ear normal.     Left Ear: Tympanic membrane, ear canal and external ear normal.     Nose: Nose normal.     Mouth/Throat:     Mouth: Mucous membranes are moist.     Dentition: Abnormal dentition. Dental caries present. No dental abscesses.     Pharynx: Uvula midline. No uvula swelling.     Tonsils: No tonsillar abscesses.     Comments: There is a fluctuant abscess at the base of tooth 29.  Gums are erythematous and edematous.  Mild edema over the right mandible consistent with abscess. Eyes:     Pupils:  Pupils are equal, round, and reactive to light.  Neck:     Comments: No neck swelling or Lugwig's angina Musculoskeletal:     Cervical back: Normal range of motion and neck supple.  Skin:    General: Skin is warm and dry.  Neurological:     Mental Status: He is alert.  Psychiatric:        Mood and Affect: Mood normal.     (all labs ordered are listed, but only abnormal results are displayed) Labs Reviewed - No data to display  EKG: None  Radiology: No results found.   .Incision and Drainage  Date/Time: 07/30/2024 10:21 PM  Performed by: Desiderio Chew, PA-C Authorized by: Desiderio Chew, PA-C   Consent:    Consent obtained:  Verbal   Consent given by:  Patient   Risks discussed:  Bleeding and pain   Alternatives discussed: Antibiotics. Universal protocol:    Patient identity confirmed:  Verbally with patient and provided demographic data Location:    Type:  Abscess   Size:  1 cm   Location:  Mouth   Mouth location: Base of tooth #29 on gumline. Anesthesia:    Anesthesia method:  Topical application   Topical anesthetic:  Benzocaine gel Procedure type:    Complexity:  Simple Procedure details:    Needle aspiration: yes     Needle size:  22 G   Drainage:  Purulent and bloody   Drainage amount: 1 cc. Post-procedure details:    Procedure completion:  Tolerated well, no immediate complications    Medications Ordered in the ED  amoxicillin -clavulanate (AUGMENTIN ) 875-125 MG per tablet 1 tablet (1 tablet Oral Given 07/30/24 2119)  HYDROcodone -acetaminophen  (NORCO/VICODIN) 5-325 MG per tablet 1 tablet (1 tablet Oral Given 07/30/24 2119)   ED Course  Patient seen and examined. History obtained directly from patient.   Labs/EKG: None ordered  Imaging: None ordered  Medications/Fluids: Ordered: P.o. Augmentin , Vicodin, benzocaine.   Most recent vital signs reviewed and are as follows: BP (!) 159/90   Pulse 94   Temp 98 F (36.7 C)   Resp 16   SpO2 100%    Initial impression: Dental abscess.  Attempt at needle aspiration discussed with patient and he agrees to proceed.  10:23 PM needle aspiration performed without complication.  Plan: Discharge to home.   Prescriptions written for: Augmentin , # 6 tablets hydrocodone /acetaminophen   Other home care instructions discussed: Avoidance of chewing or other activities that makes the symptoms worse. Eat soft foods if needed and maintain good hydration.   ED return instructions discussed: Encouraged patient to return with worsening facial or neck swelling, difficulty breathing or swallowing, fever.   Follow-up instructions discussed: Patient encouraged to follow-up with provided dental referral, resources -- or their own dentist if able.                                   Medical Decision Making Risk Prescription drug management.   Patient presents for dental pain. They do not have a fever and do not appear septic. Exam unconcerning for Ludwig's angina or other deep tissue infection in neck and I do not feel that advanced imaging is indicated at this time. Low suspicion for PTA, RPA, epiglottis based on exam.   He had a visible dental abscess amenable to needle aspiration and this was performed.  Patient will be treated for dental infection with antibiotic. Encouraged tylenol /NSAIDs as prescribed or as directed on the packaging for pain.  Also given short course of Vicodin given complicated dental pain.  Encouraged follow-up with a dentist for definitive and long-term management.       Final diagnoses:  Dental abscess    ED Discharge Orders          Ordered    amoxicillin -clavulanate (AUGMENTIN ) 875-125 MG tablet  Every 12 hours        07/30/24 2141    HYDROcodone -acetaminophen  (NORCO/VICODIN) 5-325 MG tablet  Every 6 hours PRN        07/30/24 2141               Desiderio Chew, PA-C 07/30/24 2225    Desiderio Chew, PA-C 07/30/24 2225    Charlyn Sora, MD 07/31/24  1446

## 2024-07-30 NOTE — ED Triage Notes (Signed)
 Patient c/o dental pain x 2 days. Patient report taking Ibuprofen  round the clock. No dental appointment yet. Denies fever.

## 2024-07-30 NOTE — Discharge Instructions (Signed)
 Please read and follow all provided instructions.  Your diagnoses today include:  1. Dental abscess    The exam and treatment you received today has been provided on an emergency basis only. This is not a substitute for complete medical or dental care.  Tests performed today include: Vital signs. See below for your results today.   Medications prescribed:  Augmentin  - antibiotic  You have been prescribed an antibiotic medicine: take the entire course of medicine even if you are feeling better. Stopping early can cause the antibiotic not to work.  Vicodin (hydrocodone /acetaminophen ) - narcotic pain medication  DO NOT drive or perform any activities that require you to be awake and alert because this medicine can make you drowsy. BE VERY CAREFUL not to take multiple medicines containing Tylenol  (also called acetaminophen ). Doing so can lead to an overdose which can damage your liver and cause liver failure and possibly death.  Take any prescribed medications only as directed.  Home care instructions:  Follow any educational materials contained in this packet.  Follow-up instructions: Please follow-up with your dentist for further evaluation of your symptoms.   Dental Assistance: See attached dental referral and/or resource guide.   Return instructions:  Please return to the Emergency Department if you experience worsening symptoms. Please return if you develop a fever, you develop more swelling in your face or neck, you have trouble breathing or swallowing food. Please return if you have any other emergent concerns.  Additional Information:  Your vital signs today were: BP (!) 159/90   Pulse 94   Temp 98 F (36.7 C)   Resp 16   SpO2 100%  If your blood pressure (BP) was elevated above 135/85 this visit, please have this repeated by your doctor within one month. --------------
